# Patient Record
Sex: Male | Born: 1985 | Race: White | Hispanic: No | Marital: Single | Smoking: Current every day smoker
Health system: Southern US, Community
[De-identification: ages and names within clinical notes are randomized; demographics above are authoritative.]

## PROBLEM LIST (undated history)

## (undated) DIAGNOSIS — R569 Unspecified convulsions: Secondary | ICD-10-CM

---

## 2009-04-21 ENCOUNTER — Encounter
Admission: RE | Admit: 2009-04-21 | Discharge: 2009-06-11 | Payer: Self-pay | Admitting: Physical Medicine & Rehabilitation

## 2009-04-22 ENCOUNTER — Ambulatory Visit: Payer: Self-pay | Admitting: Physical Medicine & Rehabilitation

## 2010-10-12 ENCOUNTER — Encounter (HOSPITAL_COMMUNITY)
Admission: RE | Admit: 2010-10-12 | Discharge: 2010-10-12 | Disposition: A | Discharge status: Home / Self Care | Payer: Medicare Other | Source: Ambulatory Visit | Attending: Oral Surgery | Admitting: Oral Surgery

## 2010-10-12 LAB — BASIC METABOLIC PANEL
BUN: 6 mg/dL (ref 6–23)
Chloride: 108 mEq/L (ref 96–112)
Creatinine, Ser: 0.86 mg/dL (ref 0.4–1.5)
GFR calc Af Amer: >60 mL/min (ref >60)
GFR calc non Af Amer: >60 mL/min (ref >60)

## 2010-10-12 LAB — CBC
MCH: 32.3 pg (ref 26.0–34.0)
Platelets: 311 10*3/uL (ref 150–400)
RBC: 4.89 MIL/uL (ref 4.22–5.81)
RDW: 12.6 % (ref 11.5–15.5)
WBC: 8 10*3/uL (ref 4.0–10.5)

## 2010-10-13 ENCOUNTER — Ambulatory Visit (HOSPITAL_COMMUNITY)
Admission: RE | Admit: 2010-10-13 | Discharge: 2010-10-13 | Disposition: A | Discharge status: Home / Self Care | Payer: Medicare Other | Source: Ambulatory Visit | Attending: Oral Surgery | Admitting: Oral Surgery

## 2010-10-13 DIAGNOSIS — Z79899 Other long term (current) drug therapy: Secondary | ICD-10-CM | POA: Insufficient documentation

## 2010-10-13 DIAGNOSIS — K029 Dental caries, unspecified: Secondary | ICD-10-CM | POA: Insufficient documentation

## 2010-10-13 DIAGNOSIS — Z01812 Encounter for preprocedural laboratory examination: Secondary | ICD-10-CM | POA: Insufficient documentation

## 2010-10-19 NOTE — Op Note (Signed)
NAMEDREXEL, IVEY               ACCOUNT NO.:  [id number]  MEDICAL RECORD NO.:  [id number]           PATIENT TYPE:  LOCATION:                                 FACILITY:  PHYSICIAN:  Georgia Lopes, M.D.  DATE OF BIRTH:  [birth year]  DATE OF PROCEDURE:  10/13/2010 DATE OF DISCHARGE:                              OPERATIVE REPORT   PREOPERATIVE DIAGNOSIS:  Nonrestorable teeth numbers 2, 3, 4, 5, 6, 12, 13, 14, 15, 18, 19, 20, 21, 28, 29, 30, 31.  POSTOPERATIVE DIAGNOSIS:  Nonrestorable teeth numbers 2, 3, 4, 5, 6, 12, 13, 14, 15, 18, 19, 20, 21, 28, 29, 30, 31.  PROCEDURES: 1. Surgical removal of teeth numbers 2, 3, 4, 5, 6, 12, 13, 14, 15,     18, 19, 20, 21, 28, 29, 30, 31. 2. Alveoplasty, maxilla and mandible.  SURGEON:  Georgia Lopes, MD  ANESTHESIA:  General, nasal, Dr. Jacklynn Bue attending.  ASSISTANTS: 1. Luberta Mutter, DOMA 2. Marlana Latus, DOMA  INDICATIONS FOR PROCEDURE:  Eddie Fitzgerald is a 25 year old male who was referred by his general dentist for multiple dental extractions and alveoplasty.  His past medical history is relatively benign, however, because of the number of teeth to be removed and the need for airway control, the patient was scheduled for general anesthesia and intubation at [facility].  PROCEDURE:  The patient was taken to the operating room, placed on the table in supine position.  General anesthesia was administered intravenously and a nasal endotracheal tube was placed and secured.  The eyes were protected, and the patient was draped for the procedure.  The posterior pharynx was suctioned and a throat pack was placed.  A 2% lidocaine 1:100,000 epinephrine was infiltrated in the inferior alveolar block in the right and left side and palatal and maxillary buccal and palatal infiltration around teeth to be removed in the maxilla, a total of 15 mL of solution was utilized.  A bite block was placed in the right side of the mouth and the left side  was operated first.  A #15 blade was used make a full-thickness incision on the buccal and lingual aspect of teeth numbers 18, 19, 20, 21 in the mandible and on the buccal and palatal aspects of teeth numbers 12, 13, 14, and 15 in the maxilla.  The periosteum was reflected around all these teeth using a periosteal elevator and an interproximal bone was removed with a striker handpiece and a fissure bur.  The teeth were elevated with a 301 elevator and the upper teeth were removed with a #150 forceps and the lower teeth were removed with the #17 and Asch forceps.  The teeth numbers 18 and 19 required further sectioning and removal with a rongeur and then teeth numbers 12, 13, 14, and 15 also required further sectioning and removal with a rongeur.  Then, the periosteum was further reflected and an alveoplasty was performed in the maxilla and mandible using an egg- shaped bur and bone file.  Sockets areas were irrigated and closed with 3-0 chromic.  Bite block was repositioned.  Attention was turned to the right  side.  A #15 blade was used to make a full-thickness incision buccally and palatally around teeth numbers 2, 3, 4, 5, and 6 in the maxilla and buccally and lingually around the teeth numbers 28, 29, 30, 31.  The periosteum was reflected with periosteal elevator, interproximal bone was removed with a handpiece.  The teeth were elevated with 301 elevator and removed from the mouth with a 150 forceps in the maxilla and the Asch forceps in the mandible in the premolar region and the cow horn forceps in the molar region.  Additional sectioning was required in teeth numbers 30 and 31 and a root tip was removed in the mesial aspect of #30.  In the maxilla, additional sectioning was required around the teeth and they were removed with the rongeurs and the sockets were curetted.  The periosteum was further reflected and alveoplasty was performed in the maxilla and mandible with the egg-shaped  bur and bone file and the areas were irrigated and closed with 3-0 chromic.  The oral cavity was then inspected and found to have good closure and continuity.  The oral cavity was irrigated and suctioned.  Throat pack was removed.  The patient was awakened, taken to the recovery room breathing spontaneously in good condition.  ESTIMATED BLOOD LOSS:  Minimum.  COMPLICATIONS:  None.  SPECIMENS:  None.     Georgia Lopes, M.D.     SMJ/MEDQ  D:  10/13/2010  T:  10/14/2010  Job:  [id number]  Electronically Signed by Ocie Doyne M.D. on 10/19/2010 10:24:59 AM

## 2014-06-22 DIAGNOSIS — G894 Chronic pain syndrome: Secondary | ICD-10-CM | POA: Diagnosis not present

## 2014-06-22 DIAGNOSIS — G40909 Epilepsy, unspecified, not intractable, without status epilepticus: Secondary | ICD-10-CM | POA: Diagnosis not present

## 2014-06-22 DIAGNOSIS — Z79891 Long term (current) use of opiate analgesic: Secondary | ICD-10-CM | POA: Diagnosis not present

## 2014-06-22 DIAGNOSIS — F17210 Nicotine dependence, cigarettes, uncomplicated: Secondary | ICD-10-CM | POA: Diagnosis not present

## 2014-06-22 DIAGNOSIS — K219 Gastro-esophageal reflux disease without esophagitis: Secondary | ICD-10-CM | POA: Diagnosis not present

## 2014-06-22 DIAGNOSIS — R300 Dysuria: Secondary | ICD-10-CM | POA: Diagnosis not present

## 2014-07-04 DIAGNOSIS — G43001 Migraine without aura, not intractable, with status migrainosus: Secondary | ICD-10-CM | POA: Diagnosis not present

## 2014-07-04 DIAGNOSIS — R569 Unspecified convulsions: Secondary | ICD-10-CM | POA: Diagnosis not present

## 2014-07-17 DIAGNOSIS — M791 Myalgia: Secondary | ICD-10-CM | POA: Diagnosis not present

## 2014-07-17 DIAGNOSIS — G8921 Chronic pain due to trauma: Secondary | ICD-10-CM | POA: Diagnosis not present

## 2014-08-06 DIAGNOSIS — G40909 Epilepsy, unspecified, not intractable, without status epilepticus: Secondary | ICD-10-CM | POA: Diagnosis not present

## 2014-08-06 DIAGNOSIS — R300 Dysuria: Secondary | ICD-10-CM | POA: Diagnosis not present

## 2014-08-06 DIAGNOSIS — F17210 Nicotine dependence, cigarettes, uncomplicated: Secondary | ICD-10-CM | POA: Diagnosis not present

## 2014-08-06 DIAGNOSIS — R0781 Pleurodynia: Secondary | ICD-10-CM | POA: Diagnosis not present

## 2014-08-06 DIAGNOSIS — G8929 Other chronic pain: Secondary | ICD-10-CM | POA: Diagnosis not present

## 2014-08-06 DIAGNOSIS — M79606 Pain in leg, unspecified: Secondary | ICD-10-CM | POA: Diagnosis not present

## 2014-08-06 DIAGNOSIS — K219 Gastro-esophageal reflux disease without esophagitis: Secondary | ICD-10-CM | POA: Diagnosis not present

## 2014-08-06 DIAGNOSIS — G43909 Migraine, unspecified, not intractable, without status migrainosus: Secondary | ICD-10-CM | POA: Diagnosis not present

## 2014-08-06 DIAGNOSIS — R109 Unspecified abdominal pain: Secondary | ICD-10-CM | POA: Diagnosis not present

## 2014-08-18 DIAGNOSIS — F17210 Nicotine dependence, cigarettes, uncomplicated: Secondary | ICD-10-CM | POA: Diagnosis not present

## 2014-08-18 DIAGNOSIS — R569 Unspecified convulsions: Secondary | ICD-10-CM | POA: Diagnosis not present

## 2014-10-02 DIAGNOSIS — G8921 Chronic pain due to trauma: Secondary | ICD-10-CM | POA: Diagnosis not present

## 2014-10-02 DIAGNOSIS — M791 Myalgia: Secondary | ICD-10-CM | POA: Diagnosis not present

## 2014-10-03 DIAGNOSIS — G43001 Migraine without aura, not intractable, with status migrainosus: Secondary | ICD-10-CM | POA: Diagnosis not present

## 2014-10-03 DIAGNOSIS — R569 Unspecified convulsions: Secondary | ICD-10-CM | POA: Diagnosis not present

## 2014-11-06 DIAGNOSIS — M546 Pain in thoracic spine: Secondary | ICD-10-CM | POA: Diagnosis not present

## 2014-11-06 DIAGNOSIS — M2550 Pain in unspecified joint: Secondary | ICD-10-CM | POA: Diagnosis not present

## 2014-11-06 DIAGNOSIS — G40909 Epilepsy, unspecified, not intractable, without status epilepticus: Secondary | ICD-10-CM | POA: Diagnosis not present

## 2014-11-06 DIAGNOSIS — F17210 Nicotine dependence, cigarettes, uncomplicated: Secondary | ICD-10-CM | POA: Diagnosis not present

## 2014-11-06 DIAGNOSIS — I10 Essential (primary) hypertension: Secondary | ICD-10-CM | POA: Diagnosis not present

## 2014-11-06 DIAGNOSIS — R252 Cramp and spasm: Secondary | ICD-10-CM | POA: Diagnosis not present

## 2014-11-06 DIAGNOSIS — E782 Mixed hyperlipidemia: Secondary | ICD-10-CM | POA: Diagnosis not present

## 2014-11-06 DIAGNOSIS — Z716 Tobacco abuse counseling: Secondary | ICD-10-CM | POA: Diagnosis not present

## 2014-11-06 DIAGNOSIS — E1165 Type 2 diabetes mellitus with hyperglycemia: Secondary | ICD-10-CM | POA: Diagnosis not present

## 2014-11-06 DIAGNOSIS — Z720 Tobacco use: Secondary | ICD-10-CM | POA: Diagnosis not present

## 2014-11-06 DIAGNOSIS — M545 Low back pain: Secondary | ICD-10-CM | POA: Diagnosis not present

## 2014-12-06 DIAGNOSIS — F11220 Opioid dependence with intoxication, uncomplicated: Secondary | ICD-10-CM | POA: Diagnosis not present

## 2014-12-06 DIAGNOSIS — M546 Pain in thoracic spine: Secondary | ICD-10-CM | POA: Diagnosis not present

## 2014-12-06 DIAGNOSIS — M545 Low back pain: Secondary | ICD-10-CM | POA: Diagnosis not present

## 2014-12-06 DIAGNOSIS — G43909 Migraine, unspecified, not intractable, without status migrainosus: Secondary | ICD-10-CM | POA: Diagnosis not present

## 2014-12-06 DIAGNOSIS — Z79891 Long term (current) use of opiate analgesic: Secondary | ICD-10-CM | POA: Diagnosis not present

## 2014-12-06 DIAGNOSIS — G40909 Epilepsy, unspecified, not intractable, without status epilepticus: Secondary | ICD-10-CM | POA: Diagnosis not present

## 2015-01-02 DIAGNOSIS — R569 Unspecified convulsions: Secondary | ICD-10-CM | POA: Diagnosis not present

## 2015-01-02 DIAGNOSIS — G43001 Migraine without aura, not intractable, with status migrainosus: Secondary | ICD-10-CM | POA: Diagnosis not present

## 2015-01-03 DIAGNOSIS — F11220 Opioid dependence with intoxication, uncomplicated: Secondary | ICD-10-CM | POA: Diagnosis not present

## 2015-01-03 DIAGNOSIS — G43909 Migraine, unspecified, not intractable, without status migrainosus: Secondary | ICD-10-CM | POA: Diagnosis not present

## 2015-01-03 DIAGNOSIS — G40909 Epilepsy, unspecified, not intractable, without status epilepticus: Secondary | ICD-10-CM | POA: Diagnosis not present

## 2015-01-03 DIAGNOSIS — J302 Other seasonal allergic rhinitis: Secondary | ICD-10-CM | POA: Diagnosis not present

## 2015-01-03 DIAGNOSIS — M545 Low back pain: Secondary | ICD-10-CM | POA: Diagnosis not present

## 2015-01-03 DIAGNOSIS — Z79891 Long term (current) use of opiate analgesic: Secondary | ICD-10-CM | POA: Diagnosis not present

## 2015-01-03 DIAGNOSIS — M546 Pain in thoracic spine: Secondary | ICD-10-CM | POA: Diagnosis not present

## 2015-01-31 DIAGNOSIS — F17210 Nicotine dependence, cigarettes, uncomplicated: Secondary | ICD-10-CM | POA: Diagnosis not present

## 2015-01-31 DIAGNOSIS — Z79891 Long term (current) use of opiate analgesic: Secondary | ICD-10-CM | POA: Diagnosis not present

## 2015-01-31 DIAGNOSIS — M545 Low back pain: Secondary | ICD-10-CM | POA: Diagnosis not present

## 2015-01-31 DIAGNOSIS — Z720 Tobacco use: Secondary | ICD-10-CM | POA: Diagnosis not present

## 2015-01-31 DIAGNOSIS — Z716 Tobacco abuse counseling: Secondary | ICD-10-CM | POA: Diagnosis not present

## 2015-01-31 DIAGNOSIS — F11220 Opioid dependence with intoxication, uncomplicated: Secondary | ICD-10-CM | POA: Diagnosis not present

## 2015-01-31 DIAGNOSIS — J984 Other disorders of lung: Secondary | ICD-10-CM | POA: Diagnosis not present

## 2015-03-03 DIAGNOSIS — M546 Pain in thoracic spine: Secondary | ICD-10-CM | POA: Diagnosis not present

## 2015-03-03 DIAGNOSIS — M545 Low back pain: Secondary | ICD-10-CM | POA: Diagnosis not present

## 2015-03-03 DIAGNOSIS — Z5181 Encounter for therapeutic drug level monitoring: Secondary | ICD-10-CM | POA: Diagnosis not present

## 2015-03-03 DIAGNOSIS — Z23 Encounter for immunization: Secondary | ICD-10-CM | POA: Diagnosis not present

## 2015-03-03 DIAGNOSIS — Z79891 Long term (current) use of opiate analgesic: Secondary | ICD-10-CM | POA: Diagnosis not present

## 2015-03-03 DIAGNOSIS — G40909 Epilepsy, unspecified, not intractable, without status epilepticus: Secondary | ICD-10-CM | POA: Diagnosis not present

## 2015-03-03 DIAGNOSIS — K580 Irritable bowel syndrome with diarrhea: Secondary | ICD-10-CM | POA: Diagnosis not present

## 2015-03-03 DIAGNOSIS — F11220 Opioid dependence with intoxication, uncomplicated: Secondary | ICD-10-CM | POA: Diagnosis not present

## 2015-03-12 DIAGNOSIS — G894 Chronic pain syndrome: Secondary | ICD-10-CM | POA: Diagnosis not present

## 2015-03-12 DIAGNOSIS — Z79891 Long term (current) use of opiate analgesic: Secondary | ICD-10-CM | POA: Diagnosis not present

## 2015-04-02 DIAGNOSIS — M545 Low back pain: Secondary | ICD-10-CM | POA: Diagnosis not present

## 2015-04-02 DIAGNOSIS — M546 Pain in thoracic spine: Secondary | ICD-10-CM | POA: Diagnosis not present

## 2015-04-02 DIAGNOSIS — Z79891 Long term (current) use of opiate analgesic: Secondary | ICD-10-CM | POA: Diagnosis not present

## 2015-04-02 DIAGNOSIS — G43909 Migraine, unspecified, not intractable, without status migrainosus: Secondary | ICD-10-CM | POA: Diagnosis not present

## 2015-04-02 DIAGNOSIS — F11220 Opioid dependence with intoxication, uncomplicated: Secondary | ICD-10-CM | POA: Diagnosis not present

## 2015-04-30 DIAGNOSIS — Z79891 Long term (current) use of opiate analgesic: Secondary | ICD-10-CM | POA: Diagnosis not present

## 2015-04-30 DIAGNOSIS — K219 Gastro-esophageal reflux disease without esophagitis: Secondary | ICD-10-CM | POA: Diagnosis not present

## 2015-04-30 DIAGNOSIS — Z681 Body mass index (BMI) 19 or less, adult: Secondary | ICD-10-CM | POA: Diagnosis not present

## 2015-04-30 DIAGNOSIS — I10 Essential (primary) hypertension: Secondary | ICD-10-CM | POA: Diagnosis not present

## 2015-04-30 DIAGNOSIS — M545 Low back pain: Secondary | ICD-10-CM | POA: Diagnosis not present

## 2015-04-30 DIAGNOSIS — Z5181 Encounter for therapeutic drug level monitoring: Secondary | ICD-10-CM | POA: Diagnosis not present

## 2015-04-30 DIAGNOSIS — F11220 Opioid dependence with intoxication, uncomplicated: Secondary | ICD-10-CM | POA: Diagnosis not present

## 2015-04-30 DIAGNOSIS — E782 Mixed hyperlipidemia: Secondary | ICD-10-CM | POA: Diagnosis not present

## 2015-04-30 DIAGNOSIS — E1165 Type 2 diabetes mellitus with hyperglycemia: Secondary | ICD-10-CM | POA: Diagnosis not present

## 2015-05-08 DIAGNOSIS — G43001 Migraine without aura, not intractable, with status migrainosus: Secondary | ICD-10-CM | POA: Diagnosis not present

## 2015-05-08 DIAGNOSIS — R569 Unspecified convulsions: Secondary | ICD-10-CM | POA: Diagnosis not present

## 2015-05-29 DIAGNOSIS — Z5181 Encounter for therapeutic drug level monitoring: Secondary | ICD-10-CM | POA: Diagnosis not present

## 2015-05-29 DIAGNOSIS — F11220 Opioid dependence with intoxication, uncomplicated: Secondary | ICD-10-CM | POA: Diagnosis not present

## 2015-05-29 DIAGNOSIS — M546 Pain in thoracic spine: Secondary | ICD-10-CM | POA: Diagnosis not present

## 2015-05-29 DIAGNOSIS — G40909 Epilepsy, unspecified, not intractable, without status epilepticus: Secondary | ICD-10-CM | POA: Diagnosis not present

## 2015-05-29 DIAGNOSIS — M545 Low back pain: Secondary | ICD-10-CM | POA: Diagnosis not present

## 2015-05-29 DIAGNOSIS — Z79891 Long term (current) use of opiate analgesic: Secondary | ICD-10-CM | POA: Diagnosis not present

## 2015-05-29 DIAGNOSIS — Z6820 Body mass index (BMI) 20.0-20.9, adult: Secondary | ICD-10-CM | POA: Diagnosis not present

## 2015-07-01 DIAGNOSIS — S61411A Laceration without foreign body of right hand, initial encounter: Secondary | ICD-10-CM | POA: Diagnosis not present

## 2015-07-01 DIAGNOSIS — F11220 Opioid dependence with intoxication, uncomplicated: Secondary | ICD-10-CM | POA: Diagnosis not present

## 2015-07-01 DIAGNOSIS — Z79891 Long term (current) use of opiate analgesic: Secondary | ICD-10-CM | POA: Diagnosis not present

## 2015-07-01 DIAGNOSIS — G8929 Other chronic pain: Secondary | ICD-10-CM | POA: Diagnosis not present

## 2015-07-30 DIAGNOSIS — Z79891 Long term (current) use of opiate analgesic: Secondary | ICD-10-CM | POA: Diagnosis not present

## 2015-07-30 DIAGNOSIS — G8929 Other chronic pain: Secondary | ICD-10-CM | POA: Diagnosis not present

## 2015-07-30 DIAGNOSIS — F1190 Opioid use, unspecified, uncomplicated: Secondary | ICD-10-CM | POA: Diagnosis not present

## 2015-07-30 DIAGNOSIS — F11220 Opioid dependence with intoxication, uncomplicated: Secondary | ICD-10-CM | POA: Diagnosis not present

## 2015-07-30 DIAGNOSIS — M545 Low back pain: Secondary | ICD-10-CM | POA: Diagnosis not present

## 2015-07-30 DIAGNOSIS — Z681 Body mass index (BMI) 19 or less, adult: Secondary | ICD-10-CM | POA: Diagnosis not present

## 2015-08-27 DIAGNOSIS — Z79891 Long term (current) use of opiate analgesic: Secondary | ICD-10-CM | POA: Diagnosis not present

## 2015-08-27 DIAGNOSIS — Z23 Encounter for immunization: Secondary | ICD-10-CM | POA: Diagnosis not present

## 2015-08-27 DIAGNOSIS — F11220 Opioid dependence with intoxication, uncomplicated: Secondary | ICD-10-CM | POA: Diagnosis not present

## 2015-08-27 DIAGNOSIS — F1190 Opioid use, unspecified, uncomplicated: Secondary | ICD-10-CM | POA: Diagnosis not present

## 2015-08-27 DIAGNOSIS — Z681 Body mass index (BMI) 19 or less, adult: Secondary | ICD-10-CM | POA: Diagnosis not present

## 2015-08-27 DIAGNOSIS — G8929 Other chronic pain: Secondary | ICD-10-CM | POA: Diagnosis not present

## 2015-09-14 DIAGNOSIS — K219 Gastro-esophageal reflux disease without esophagitis: Secondary | ICD-10-CM | POA: Diagnosis not present

## 2015-09-14 DIAGNOSIS — M549 Dorsalgia, unspecified: Secondary | ICD-10-CM | POA: Diagnosis not present

## 2015-09-14 DIAGNOSIS — F419 Anxiety disorder, unspecified: Secondary | ICD-10-CM | POA: Diagnosis not present

## 2015-09-14 DIAGNOSIS — G40909 Epilepsy, unspecified, not intractable, without status epilepticus: Secondary | ICD-10-CM | POA: Diagnosis not present

## 2015-09-14 DIAGNOSIS — F17210 Nicotine dependence, cigarettes, uncomplicated: Secondary | ICD-10-CM | POA: Diagnosis not present

## 2015-09-14 DIAGNOSIS — F329 Major depressive disorder, single episode, unspecified: Secondary | ICD-10-CM | POA: Diagnosis not present

## 2015-09-14 DIAGNOSIS — Z886 Allergy status to analgesic agent status: Secondary | ICD-10-CM | POA: Diagnosis not present

## 2015-09-14 DIAGNOSIS — R51 Headache: Secondary | ICD-10-CM | POA: Diagnosis not present

## 2015-09-14 DIAGNOSIS — G43909 Migraine, unspecified, not intractable, without status migrainosus: Secondary | ICD-10-CM | POA: Diagnosis not present

## 2015-09-14 DIAGNOSIS — M79606 Pain in leg, unspecified: Secondary | ICD-10-CM | POA: Diagnosis not present

## 2015-09-14 DIAGNOSIS — G8929 Other chronic pain: Secondary | ICD-10-CM | POA: Diagnosis not present

## 2015-09-14 DIAGNOSIS — R569 Unspecified convulsions: Secondary | ICD-10-CM | POA: Diagnosis not present

## 2015-09-19 DIAGNOSIS — R51 Headache: Secondary | ICD-10-CM | POA: Diagnosis not present

## 2015-09-19 DIAGNOSIS — G43909 Migraine, unspecified, not intractable, without status migrainosus: Secondary | ICD-10-CM | POA: Diagnosis not present

## 2015-09-23 ENCOUNTER — Other Ambulatory Visit: Payer: Self-pay

## 2015-09-23 NOTE — Patient Outreach (Signed)
[facility]) Care Management  09/23/2015  Glory BuffMichael A Theiss [birth year] [id number]   Telephone call to patient regarding [employer] health care high risk referral. Unable to reach patient. HIPAA compliant voice message left with call back phone number.   PLAN:  RNCM will attempt 2nd telephone call to patient within 1 week.   George InaDavina Mylan Schwarz RN,BSN,CCM [facility] Telephonic  [phone]

## 2015-09-24 DIAGNOSIS — Z79891 Long term (current) use of opiate analgesic: Secondary | ICD-10-CM | POA: Diagnosis not present

## 2015-09-24 DIAGNOSIS — E790 Hyperuricemia without signs of inflammatory arthritis and tophaceous disease: Secondary | ICD-10-CM | POA: Diagnosis not present

## 2015-09-24 DIAGNOSIS — F1190 Opioid use, unspecified, uncomplicated: Secondary | ICD-10-CM | POA: Diagnosis not present

## 2015-09-24 DIAGNOSIS — E782 Mixed hyperlipidemia: Secondary | ICD-10-CM | POA: Diagnosis not present

## 2015-09-24 DIAGNOSIS — I10 Essential (primary) hypertension: Secondary | ICD-10-CM | POA: Diagnosis not present

## 2015-09-24 DIAGNOSIS — F11220 Opioid dependence with intoxication, uncomplicated: Secondary | ICD-10-CM | POA: Diagnosis not present

## 2015-09-24 DIAGNOSIS — F17210 Nicotine dependence, cigarettes, uncomplicated: Secondary | ICD-10-CM | POA: Diagnosis not present

## 2015-09-24 DIAGNOSIS — E039 Hypothyroidism, unspecified: Secondary | ICD-10-CM | POA: Diagnosis not present

## 2015-09-24 DIAGNOSIS — E1165 Type 2 diabetes mellitus with hyperglycemia: Secondary | ICD-10-CM | POA: Diagnosis not present

## 2015-09-24 DIAGNOSIS — M545 Low back pain: Secondary | ICD-10-CM | POA: Diagnosis not present

## 2015-09-24 DIAGNOSIS — Z5181 Encounter for therapeutic drug level monitoring: Secondary | ICD-10-CM | POA: Diagnosis not present

## 2015-09-24 DIAGNOSIS — Z681 Body mass index (BMI) 19 or less, adult: Secondary | ICD-10-CM | POA: Diagnosis not present

## 2015-09-24 DIAGNOSIS — R252 Cramp and spasm: Secondary | ICD-10-CM | POA: Diagnosis not present

## 2015-09-25 ENCOUNTER — Other Ambulatory Visit: Payer: Self-pay

## 2015-09-25 NOTE — Patient Outreach (Signed)
[facility]) Care Management  09/25/2015  Eddie Fitzgerald [birth year] [id number]  Telephone call to patient regarding united health care high risk assessment.  Call attempted to home number and mobile number. Unable to reach. HIPAA compliant voice message left with call back phone number. Call attempted to emergency contact. Person answering phone states patient is not there. HIPAA compliant voice message left with call back phone number.   PLAN: RNCM will attempt 3rd telephone outreach to patient within 1 week.   George InaDavina Halli Equihua RN,BSN,CCM [facility] Telephonic  [phone]

## 2015-09-29 ENCOUNTER — Ambulatory Visit: Payer: Self-pay

## 2015-09-30 ENCOUNTER — Other Ambulatory Visit: Payer: Self-pay

## 2015-09-30 NOTE — Patient Outreach (Signed)
[facility]) Care Management  09/30/2015  Glory BuffMichael A Husted Jan 23, 1986 [id number]  Third telephone call to patient regarding united health care high risk referral. Unable to reach patient. Attempted call to listed home number. Phone is no longer in services. Attempted call to mobile number and patients emergency contact  Fannie KneeSue lattimore,  HIPAA compliant voice message with call back phone number left at both numbers.  PLAN: RNCM will send patient outreach letter to attempt contact.   George InaDavina Noorah Giammona RN,BSN,CCM [facility] Telephonic  [phone]

## 2015-10-22 ENCOUNTER — Other Ambulatory Visit: Payer: Self-pay

## 2015-10-22 NOTE — Patient Outreach (Signed)
[facility] ([facility]) Care Management  10/22/2015  Glory BuffMichael A Menger 10-14-1985 [id number]   No response from patient after 3 telephone attempts and outreach letter.   PLAN: RNCM will refer patient to Sherle PoeNicole Robinson for closure due to inability to contact Unable to notify primary MD due to no primary MD being listed.   George InaDavina Maisee Vollman RN,BSN,CCM [facility] Telephonic  [phone]

## 2015-10-28 DIAGNOSIS — K219 Gastro-esophageal reflux disease without esophagitis: Secondary | ICD-10-CM | POA: Diagnosis not present

## 2015-10-28 DIAGNOSIS — I499 Cardiac arrhythmia, unspecified: Secondary | ICD-10-CM | POA: Diagnosis not present

## 2015-10-28 DIAGNOSIS — M542 Cervicalgia: Secondary | ICD-10-CM | POA: Diagnosis not present

## 2015-10-28 DIAGNOSIS — E876 Hypokalemia: Secondary | ICD-10-CM | POA: Diagnosis not present

## 2015-10-28 DIAGNOSIS — G8929 Other chronic pain: Secondary | ICD-10-CM | POA: Diagnosis not present

## 2015-10-28 DIAGNOSIS — S0181XA Laceration without foreign body of other part of head, initial encounter: Secondary | ICD-10-CM | POA: Diagnosis not present

## 2015-10-28 DIAGNOSIS — F17210 Nicotine dependence, cigarettes, uncomplicated: Secondary | ICD-10-CM | POA: Diagnosis not present

## 2015-10-28 DIAGNOSIS — G40909 Epilepsy, unspecified, not intractable, without status epilepticus: Secondary | ICD-10-CM | POA: Diagnosis not present

## 2015-10-28 DIAGNOSIS — M79606 Pain in leg, unspecified: Secondary | ICD-10-CM | POA: Diagnosis not present

## 2015-10-28 DIAGNOSIS — R569 Unspecified convulsions: Secondary | ICD-10-CM | POA: Diagnosis not present

## 2015-10-28 DIAGNOSIS — R51 Headache: Secondary | ICD-10-CM | POA: Diagnosis not present

## 2015-10-31 DIAGNOSIS — M791 Myalgia: Secondary | ICD-10-CM | POA: Diagnosis not present

## 2015-10-31 DIAGNOSIS — G43909 Migraine, unspecified, not intractable, without status migrainosus: Secondary | ICD-10-CM | POA: Diagnosis not present

## 2015-10-31 DIAGNOSIS — R569 Unspecified convulsions: Secondary | ICD-10-CM | POA: Diagnosis not present

## 2015-10-31 DIAGNOSIS — R52 Pain, unspecified: Secondary | ICD-10-CM | POA: Diagnosis not present

## 2015-10-31 DIAGNOSIS — Z79899 Other long term (current) drug therapy: Secondary | ICD-10-CM | POA: Diagnosis not present

## 2015-11-04 DIAGNOSIS — R569 Unspecified convulsions: Secondary | ICD-10-CM | POA: Diagnosis not present

## 2015-11-04 DIAGNOSIS — M542 Cervicalgia: Secondary | ICD-10-CM | POA: Diagnosis not present

## 2015-11-04 DIAGNOSIS — G8929 Other chronic pain: Secondary | ICD-10-CM | POA: Diagnosis not present

## 2015-11-04 DIAGNOSIS — Z4802 Encounter for removal of sutures: Secondary | ICD-10-CM | POA: Diagnosis not present

## 2015-11-04 DIAGNOSIS — S0181XS Laceration without foreign body of other part of head, sequela: Secondary | ICD-10-CM | POA: Diagnosis not present

## 2015-11-04 DIAGNOSIS — G43909 Migraine, unspecified, not intractable, without status migrainosus: Secondary | ICD-10-CM | POA: Diagnosis not present

## 2015-11-04 DIAGNOSIS — M79606 Pain in leg, unspecified: Secondary | ICD-10-CM | POA: Diagnosis not present

## 2015-11-04 DIAGNOSIS — Z886 Allergy status to analgesic agent status: Secondary | ICD-10-CM | POA: Diagnosis not present

## 2015-11-04 DIAGNOSIS — S161XXA Strain of muscle, fascia and tendon at neck level, initial encounter: Secondary | ICD-10-CM | POA: Diagnosis not present

## 2015-11-04 DIAGNOSIS — S161XXD Strain of muscle, fascia and tendon at neck level, subsequent encounter: Secondary | ICD-10-CM | POA: Diagnosis not present

## 2015-11-04 DIAGNOSIS — K219 Gastro-esophageal reflux disease without esophagitis: Secondary | ICD-10-CM | POA: Diagnosis not present

## 2015-11-06 DIAGNOSIS — R569 Unspecified convulsions: Secondary | ICD-10-CM | POA: Diagnosis not present

## 2015-11-10 DIAGNOSIS — G894 Chronic pain syndrome: Secondary | ICD-10-CM | POA: Diagnosis not present

## 2015-11-10 DIAGNOSIS — G8911 Acute pain due to trauma: Secondary | ICD-10-CM | POA: Diagnosis not present

## 2015-11-27 DIAGNOSIS — M79661 Pain in right lower leg: Secondary | ICD-10-CM | POA: Diagnosis not present

## 2015-11-27 DIAGNOSIS — J45909 Unspecified asthma, uncomplicated: Secondary | ICD-10-CM | POA: Diagnosis not present

## 2015-11-27 DIAGNOSIS — M79671 Pain in right foot: Secondary | ICD-10-CM | POA: Diagnosis not present

## 2015-11-27 DIAGNOSIS — S93401A Sprain of unspecified ligament of right ankle, initial encounter: Secondary | ICD-10-CM | POA: Diagnosis not present

## 2015-11-27 DIAGNOSIS — M25571 Pain in right ankle and joints of right foot: Secondary | ICD-10-CM | POA: Diagnosis not present

## 2015-11-27 DIAGNOSIS — Z79899 Other long term (current) drug therapy: Secondary | ICD-10-CM | POA: Diagnosis not present

## 2015-11-27 DIAGNOSIS — R569 Unspecified convulsions: Secondary | ICD-10-CM | POA: Diagnosis not present

## 2015-11-27 DIAGNOSIS — K219 Gastro-esophageal reflux disease without esophagitis: Secondary | ICD-10-CM | POA: Diagnosis not present

## 2015-11-27 DIAGNOSIS — M542 Cervicalgia: Secondary | ICD-10-CM | POA: Diagnosis not present

## 2015-11-27 DIAGNOSIS — S161XXA Strain of muscle, fascia and tendon at neck level, initial encounter: Secondary | ICD-10-CM | POA: Diagnosis not present

## 2015-12-21 DIAGNOSIS — S90511A Abrasion, right ankle, initial encounter: Secondary | ICD-10-CM | POA: Diagnosis not present

## 2016-01-22 DIAGNOSIS — G40909 Epilepsy, unspecified, not intractable, without status epilepticus: Secondary | ICD-10-CM | POA: Diagnosis not present

## 2016-01-22 DIAGNOSIS — G8929 Other chronic pain: Secondary | ICD-10-CM | POA: Diagnosis not present

## 2016-01-22 DIAGNOSIS — G43001 Migraine without aura, not intractable, with status migrainosus: Secondary | ICD-10-CM | POA: Diagnosis not present

## 2016-01-22 DIAGNOSIS — M545 Low back pain: Secondary | ICD-10-CM | POA: Diagnosis not present

## 2016-01-27 DIAGNOSIS — H00015 Hordeolum externum left lower eyelid: Secondary | ICD-10-CM | POA: Diagnosis not present

## 2016-02-10 DIAGNOSIS — G8929 Other chronic pain: Secondary | ICD-10-CM | POA: Diagnosis not present

## 2016-02-10 DIAGNOSIS — F419 Anxiety disorder, unspecified: Secondary | ICD-10-CM | POA: Diagnosis not present

## 2016-02-10 DIAGNOSIS — F410 Panic disorder [episodic paroxysmal anxiety] without agoraphobia: Secondary | ICD-10-CM | POA: Diagnosis not present

## 2016-03-01 DIAGNOSIS — S60222A Contusion of left hand, initial encounter: Secondary | ICD-10-CM | POA: Diagnosis not present

## 2016-03-01 DIAGNOSIS — S6990XA Unspecified injury of unspecified wrist, hand and finger(s), initial encounter: Secondary | ICD-10-CM | POA: Diagnosis not present

## 2016-03-01 DIAGNOSIS — S6992XA Unspecified injury of left wrist, hand and finger(s), initial encounter: Secondary | ICD-10-CM | POA: Diagnosis not present

## 2016-03-02 DIAGNOSIS — G4700 Insomnia, unspecified: Secondary | ICD-10-CM | POA: Diagnosis not present

## 2016-03-02 DIAGNOSIS — R413 Other amnesia: Secondary | ICD-10-CM | POA: Diagnosis not present

## 2016-03-02 DIAGNOSIS — R569 Unspecified convulsions: Secondary | ICD-10-CM | POA: Diagnosis not present

## 2016-03-07 DIAGNOSIS — R109 Unspecified abdominal pain: Secondary | ICD-10-CM | POA: Diagnosis not present

## 2016-03-15 DIAGNOSIS — M542 Cervicalgia: Secondary | ICD-10-CM | POA: Diagnosis not present

## 2016-03-15 DIAGNOSIS — Z886 Allergy status to analgesic agent status: Secondary | ICD-10-CM | POA: Diagnosis not present

## 2016-03-15 DIAGNOSIS — Z9049 Acquired absence of other specified parts of digestive tract: Secondary | ICD-10-CM | POA: Diagnosis not present

## 2016-03-15 DIAGNOSIS — R404 Transient alteration of awareness: Secondary | ICD-10-CM | POA: Diagnosis not present

## 2016-03-15 DIAGNOSIS — K219 Gastro-esophageal reflux disease without esophagitis: Secondary | ICD-10-CM | POA: Diagnosis not present

## 2016-03-15 DIAGNOSIS — Z9119 Patient's noncompliance with other medical treatment and regimen: Secondary | ICD-10-CM | POA: Diagnosis not present

## 2016-03-15 DIAGNOSIS — R569 Unspecified convulsions: Secondary | ICD-10-CM | POA: Diagnosis not present

## 2016-03-15 DIAGNOSIS — M545 Low back pain: Secondary | ICD-10-CM | POA: Diagnosis not present

## 2016-03-15 DIAGNOSIS — M549 Dorsalgia, unspecified: Secondary | ICD-10-CM | POA: Diagnosis not present

## 2016-03-15 DIAGNOSIS — R531 Weakness: Secondary | ICD-10-CM | POA: Diagnosis not present

## 2016-03-15 DIAGNOSIS — M5489 Other dorsalgia: Secondary | ICD-10-CM | POA: Diagnosis not present

## 2016-03-22 DIAGNOSIS — K219 Gastro-esophageal reflux disease without esophagitis: Secondary | ICD-10-CM | POA: Diagnosis not present

## 2016-03-22 DIAGNOSIS — M545 Low back pain: Secondary | ICD-10-CM | POA: Diagnosis not present

## 2016-03-22 DIAGNOSIS — G8929 Other chronic pain: Secondary | ICD-10-CM | POA: Diagnosis not present

## 2016-04-06 DIAGNOSIS — R569 Unspecified convulsions: Secondary | ICD-10-CM | POA: Diagnosis not present

## 2016-04-06 DIAGNOSIS — H5589 Other irregular eye movements: Secondary | ICD-10-CM | POA: Diagnosis not present

## 2016-04-06 DIAGNOSIS — R9401 Abnormal electroencephalogram [EEG]: Secondary | ICD-10-CM | POA: Diagnosis not present

## 2016-04-06 DIAGNOSIS — G9349 Other encephalopathy: Secondary | ICD-10-CM | POA: Diagnosis not present

## 2016-05-27 DIAGNOSIS — G43001 Migraine without aura, not intractable, with status migrainosus: Secondary | ICD-10-CM | POA: Diagnosis not present

## 2016-06-01 DIAGNOSIS — S61011A Laceration without foreign body of right thumb without damage to nail, initial encounter: Secondary | ICD-10-CM | POA: Diagnosis not present

## 2016-06-06 DIAGNOSIS — S61011D Laceration without foreign body of right thumb without damage to nail, subsequent encounter: Secondary | ICD-10-CM | POA: Diagnosis not present

## 2016-06-06 DIAGNOSIS — S61011A Laceration without foreign body of right thumb without damage to nail, initial encounter: Secondary | ICD-10-CM | POA: Diagnosis not present

## 2016-06-06 DIAGNOSIS — M79644 Pain in right finger(s): Secondary | ICD-10-CM | POA: Diagnosis not present

## 2016-06-07 DIAGNOSIS — M5489 Other dorsalgia: Secondary | ICD-10-CM | POA: Diagnosis not present

## 2016-06-07 DIAGNOSIS — K219 Gastro-esophageal reflux disease without esophagitis: Secondary | ICD-10-CM | POA: Diagnosis not present

## 2016-06-07 DIAGNOSIS — Z79899 Other long term (current) drug therapy: Secondary | ICD-10-CM | POA: Diagnosis not present

## 2016-06-07 DIAGNOSIS — M791 Myalgia: Secondary | ICD-10-CM | POA: Diagnosis not present

## 2016-06-07 DIAGNOSIS — R569 Unspecified convulsions: Secondary | ICD-10-CM | POA: Diagnosis not present

## 2016-06-07 DIAGNOSIS — Z886 Allergy status to analgesic agent status: Secondary | ICD-10-CM | POA: Diagnosis not present

## 2016-06-20 DIAGNOSIS — S8392XA Sprain of unspecified site of left knee, initial encounter: Secondary | ICD-10-CM | POA: Diagnosis not present

## 2016-06-20 DIAGNOSIS — S8992XA Unspecified injury of left lower leg, initial encounter: Secondary | ICD-10-CM | POA: Diagnosis not present

## 2016-06-20 DIAGNOSIS — M25562 Pain in left knee: Secondary | ICD-10-CM | POA: Diagnosis not present

## 2019-05-01 ENCOUNTER — Emergency Department (HOSPITAL_COMMUNITY): Payer: Medicare Other

## 2019-05-01 ENCOUNTER — Other Ambulatory Visit: Payer: Self-pay

## 2019-05-01 ENCOUNTER — Emergency Department (HOSPITAL_COMMUNITY)
Admission: EM | Admit: 2019-05-01 | Discharge: 2019-05-01 | Disposition: A | Discharge status: Home / Self Care | Payer: Medicare Other | Attending: Emergency Medicine | Admitting: Emergency Medicine

## 2019-05-01 ENCOUNTER — Encounter (HOSPITAL_COMMUNITY): Payer: Self-pay | Admitting: Emergency Medicine

## 2019-05-01 DIAGNOSIS — Y92512 Supermarket, store or market as the place of occurrence of the external cause: Secondary | ICD-10-CM | POA: Diagnosis not present

## 2019-05-01 DIAGNOSIS — Y9301 Activity, walking, marching and hiking: Secondary | ICD-10-CM | POA: Diagnosis not present

## 2019-05-01 DIAGNOSIS — G40909 Epilepsy, unspecified, not intractable, without status epilepticus: Secondary | ICD-10-CM | POA: Insufficient documentation

## 2019-05-01 DIAGNOSIS — W010XXA Fall on same level from slipping, tripping and stumbling without subsequent striking against object, initial encounter: Secondary | ICD-10-CM | POA: Diagnosis not present

## 2019-05-01 DIAGNOSIS — Y998 Other external cause status: Secondary | ICD-10-CM | POA: Diagnosis not present

## 2019-05-01 DIAGNOSIS — S0240FA Zygomatic fracture, left side, initial encounter for closed fracture: Secondary | ICD-10-CM | POA: Diagnosis not present

## 2019-05-01 DIAGNOSIS — Z23 Encounter for immunization: Secondary | ICD-10-CM | POA: Diagnosis not present

## 2019-05-01 DIAGNOSIS — S0181XA Laceration without foreign body of other part of head, initial encounter: Secondary | ICD-10-CM | POA: Diagnosis not present

## 2019-05-01 DIAGNOSIS — S0990XA Unspecified injury of head, initial encounter: Secondary | ICD-10-CM | POA: Diagnosis present

## 2019-05-01 DIAGNOSIS — F17200 Nicotine dependence, unspecified, uncomplicated: Secondary | ICD-10-CM | POA: Diagnosis not present

## 2019-05-01 HISTORY — DX: Unspecified convulsions: R56.9

## 2019-05-01 LAB — CBC WITH DIFFERENTIAL/PLATELET
Abs Immature Granulocytes: 0.04 10*3/uL (ref 0.00–0.07)
Basophils Absolute: 0.1 10*3/uL (ref 0.0–0.1)
Basophils Relative: 1 %
Eosinophils Absolute: 0.1 10*3/uL (ref 0.0–0.5)
Eosinophils Relative: 1 %
HCT: 44.3 % (ref 39.0–52.0)
Hemoglobin: 14.4 g/dL (ref 13.0–17.0)
Immature Granulocytes: 0 %
Lymphocytes Relative: 7 %
Lymphs Abs: 0.8 10*3/uL (ref 0.7–4.0)
MCH: 31 pg (ref 26.0–34.0)
MCHC: 32.5 g/dL (ref 30.0–36.0)
MCV: 95.3 fL (ref 80.0–100.0)
Monocytes Absolute: 0.4 10*3/uL (ref 0.1–1.0)
Monocytes Relative: 4 %
Neutro Abs: 9.3 10*3/uL — ABNORMAL HIGH (ref 1.7–7.7)
Neutrophils Relative %: 87 %
Platelets: 177 10*3/uL (ref 150–400)
RBC: 4.65 MIL/uL (ref 4.22–5.81)
RDW: 13.8 % (ref 11.5–15.5)
WBC: 10.7 10*3/uL — ABNORMAL HIGH (ref 4.0–10.5)
nRBC: 0 % (ref 0.0–0.2)

## 2019-05-01 LAB — BASIC METABOLIC PANEL
Anion gap: 12 (ref 5–15)
BUN: 10 mg/dL (ref 6–20)
CO2: 19 mmol/L — ABNORMAL LOW (ref 22–32)
Calcium: 9.3 mg/dL (ref 8.9–10.3)
Chloride: 108 mmol/L (ref 98–111)
Creatinine, Ser: 0.93 mg/dL (ref 0.61–1.24)
GFR calc Af Amer: >60 mL/min (ref >60)
GFR calc non Af Amer: >60 mL/min (ref >60)
Glucose, Bld: 85 mg/dL (ref 70–99)
Potassium: 4.7 mmol/L (ref 3.5–5.1)
Sodium: 139 mmol/L (ref 135–145)

## 2019-05-01 LAB — CBG MONITORING, ED: Glucose-Capillary: 91 mg/dL (ref 70–99)

## 2019-05-01 LAB — ETHANOL: Alcohol, Ethyl (B): <10 mg/dL (ref <10)

## 2019-05-01 MED ORDER — LIDOCAINE-EPINEPHRINE 1 %-1:100000 IJ SOLN
20.0000 mL | Freq: Once | INTRAMUSCULAR | Status: AC
  Administered 2019-05-01: 20 mL
  Filled 2019-05-01 (×2): qty 20

## 2019-05-01 MED ORDER — ACETAMINOPHEN 325 MG PO TABS
650.0000 mg | ORAL_TABLET | Freq: Once | ORAL | Status: AC
  Administered 2019-05-01: 15:00:00 650 mg via ORAL
  Filled 2019-05-01: qty 2

## 2019-05-01 MED ORDER — LIDOCAINE-EPINEPHRINE (PF) 2 %-1:200000 IJ SOLN
20.0000 mL | Freq: Once | INTRAMUSCULAR | Status: AC
  Administered 2019-05-01: 20 mL
  Filled 2019-05-01: qty 20

## 2019-05-01 MED ORDER — NAPROXEN 250 MG PO TABS
500.0000 mg | ORAL_TABLET | Freq: Once | ORAL | Status: AC
  Administered 2019-05-01: 15:00:00 500 mg via ORAL
  Filled 2019-05-01: qty 2

## 2019-05-01 MED ORDER — BACITRACIN ZINC 500 UNIT/GM EX OINT: 1.0000 "application " | TOPICAL_OINTMENT | Freq: Two times a day (BID) | CUTANEOUS | 0 refills | Status: AC

## 2019-05-01 MED ORDER — SODIUM CHLORIDE 0.9 % IV SOLN
INTRAVENOUS | Status: DC
  Administered 2019-05-01: 10:00:00 via INTRAVENOUS

## 2019-05-01 MED ORDER — TETANUS-DIPHTH-ACELL PERTUSSIS 5-2.5-18.5 LF-MCG/0.5 IM SUSP
0.5000 mL | Freq: Once | INTRAMUSCULAR | Status: AC
  Administered 2019-05-01: 10:00:00 0.5 mL via INTRAMUSCULAR
  Filled 2019-05-01: qty 0.5

## 2019-05-01 MED ORDER — FENTANYL CITRATE (PF) 100 MCG/2ML IJ SOLN
50.0000 ug | Freq: Once | INTRAMUSCULAR | Status: AC
  Administered 2019-05-01: 11:00:00 50 ug via INTRAVENOUS
  Filled 2019-05-01: qty 2

## 2019-05-01 MED ORDER — CEPHALEXIN 500 MG PO CAPS: 500.0000 mg | ORAL_CAPSULE | Freq: Four times a day (QID) | ORAL | 0 refills | Status: AC

## 2019-05-01 NOTE — Discharge Instructions (Addendum)
You are seen in the ER for seizures.  We discussed the case with neurology team at [facility] and they will try to set up an appointment for you as soon as possible.  Please call your neurologist tomorrow afternoon if you have not heard back from them.  They might consider sending you to [facility] for higher level of care.  Additionally, unfortunately you have suffered laceration to your forehead and fracture to your face.  Those injuries are stable.  Laceration care instructions:  -Cleanse wound with 1/2 strength hydrogen peroxide on a Q tip twice daily. Pat dry. Apply bacitracin ointment along incision.  -You may shower like you normally would. Do not scrub over incision. Pat dry and then reapply the bacitracin ointment. -No strenuous exercise or heavy lifting until 14 days. Walk/ambulate at home often. Perform calf and stretch exercises often to prevent blood clot formation.  -No nose blowing for 7 days. -Use nasal saline spray as needed for nasal congestion. -Use ice packs over your nose and over the left eyebrow for 15-minute intervals at least 4 times a day for the next 5 days.

## 2019-05-01 NOTE — Consult Note (Signed)
OTOLARYNGOLOGY CONSULTATION   Primary Care Physician: System, Provider Not In Patient Location at Initial Consult: Emergency Department Chief Complaint/Reason for Consult: laceration to forehead, facial fractures  History of Presenting Illness:    Eddie Fitzgerald is a  33 y.o. male presenting with facial injury.  He had a seizure and had brief loss of consciousness and fell and hit the left side of his face.  He had a little bit of nosebleeding.  He does remember the incident.  He has a history of seizures.  He has a large laceration of the left forehead.  No issues with occlusion as he is edentulous and wears dentures.  No vision changes.  No facial numbness.  No prior nasal injuries or surgeries.  Vision is intact and not blurry.  Past Medical History:  Diagnosis Date  . Seizures (HCC)     History reviewed. No pertinent surgical history.  History reviewed. No pertinent family history.  Social History   Socioeconomic History  . Marital status: Single    Spouse name: Not on file  . Number of children: Not on file  . Years of education: Not on file  . Highest education level: Not on file  Occupational History  . Not on file  Social Needs  . Financial resource strain: Not on file  . Food insecurity    Worry: Not on file    Inability: Not on file  . Transportation needs    Medical: Not on file    Non-medical: Not on file  Tobacco Use  . Smoking status: Current Every Day Smoker  Substance and Sexual Activity  . Alcohol use: Never    Frequency: Never  . Drug use: Never  . Sexual activity: Not on file  Lifestyle  . Physical activity    Days per week: Not on file    Minutes per session: Not on file  . Stress: Not on file  Relationships  . Social Musician on phone: Not on file    Gets together: Not on file    Attends religious service: Not on file    Active member of club or organization: Not on file    Attends meetings of clubs or organizations: Not on  file    Relationship status: Not on file  Other Topics Concern  . Not on file  Social History Narrative  . Not on file    No current facility-administered medications on file prior to encounter.    No current outpatient medications on file prior to encounter.    Not on File   Review of Systems: Complete ROS.  Negative except the above-mentioned OBJECTIVE: Vital Signs: Vitals:   05/01/19 1100 05/01/19 1115  BP: 115/76 122/84  Pulse: 78 86  Resp: 18 16  Temp:    SpO2: 95% 96%    I&O No intake or output data in the 24 hours ending 05/01/19 1156  Physical Exam General: Well developed, well nourished. No acute distress. Voice without dysphonia.  Noncooperative.  Head/Face: Normocephalic, atraumatic.  Large complex laceration over the left brow and extending slightly onto the left upper lid.  This is about 5 cm in length and stellate in nature involving skin as well as muscle and soft tissue  Eyes: Globes well positioned, no proptosis Lids: No periorbital edema/ecchymosis. No lid laceration Conjunctiva: No chemosis, hemorrhage PERRL Extra occular movement: Full ROM bilaterally. No gaze restriction    Ears: No gross deformity. Normal external canal. Tympanic membrane intact bilaterally and  without effusion  Hearing:  Normal speech reception.  Nose:  Mild displacement of the nasal dorsum to the right.  Septum is without hematoma though does feel somewhat mobile  Mouth/Oropharynx: Lips without any lesions.  Edentulous wearing dentures.  The palate is stable.  No mucosal lesions within the oropharynx. No tonsillar enlargement, exudate, or lesions. Pharyngeal walls symmetrical. Uvula midline. Tongue midline without lesions.  Neck: Trachea midline. No masses. No thyromegaly or nodules palpated. No crepitus.  Lymphatic: No lymphadenopathy in the neck.  Respiratory: No stridor or distress.  Cardiovascular: Regular rate and rhythm.  Extremities: No edema or cyanosis. Warm and  well-perfused.  Skin: No scars or lesions on face or neck.  Neurologic: CN II-XII intact. Moving all extremities without gross abnormality.  Other:      Labs: Lab Results  Component Value Date   WBC 10.7 (H) 05/01/2019   HGB 14.4 05/01/2019   HCT 44.3 05/01/2019   PLT 177 05/01/2019   NA 139 05/01/2019   K 4.7 05/01/2019   CL 108 05/01/2019   CREATININE 0.93 05/01/2019   BUN 10 05/01/2019   CO2 19 (L) 05/01/2019     Review of Ancillary Data / Diagnostic Tests: CT head demonstrates nasal bone fracture which is moderately displaced.  Nondisplaced left zygoma.  Anterior wall left maxilla.  Recommend CT maxillofacial.  Procedure: Complex left brow laceration repair Findings: The laceration involves skin, soft tissue and muscle and had a little bit of foreign body in it which was irrigated.  Laceration was about 5 cm in length. Details: Verbal consent was obtained with the patient and his mother at the bedside.  The skin was cleansed with Betadine.  Next, 1% lidocaine with 1: 100,000 epinephrine was injected submucosally.  The wound bed was copiously irrigated with saline.  Next, the skin was then prepped again with Betadine.  The surgical field was prepped and draped in usual sterile fashion.  Soft tissue was reapproximated with buried interrupted 4-0 undyed Vicryl.  The skin was then closed with running locking 5-0 fast-absorbing gut suture.  Skin was then again cleansed with saline.  Bacitracin was applied.  All instrumentation was removed.  The patient tolerated the procedure well and there were no complication.  ASSESSMENT:  33 y.o. male with facial injury including laceration and facial fracture.  RECOMMENDATIONS: Recommend CT maxillofacial No nose blowing for a week Ice packs to face to help with swelling Bacitracin ointment twice daily to the left brow repaired laceration Keflex for 1 week Follow-up with me in 6 days to reevaluate the nasal bone fracture.    Gavin Pound, MD  [facility] [place] Office phone [phone]

## 2019-05-01 NOTE — ED Notes (Signed)
Patient transported to CT 

## 2019-05-01 NOTE — ED Provider Notes (Signed)
[facility] EMERGENCY DEPARTMENT Provider Note   CSN: [id number] Arrival date & time: 05/01/19  4332     History   Chief Complaint Chief Complaint  Patient presents with  . Seizures    HPI Eddie Fitzgerald is a 33 y.o. male.     HPI  33 year old comes in a chief complaint of seizure. Patient has history of seizure and he is on Topamax, eslicarbazepine -he is managed at [facility].  Patient reports that his been taking his medications as prescribed and typically gets a seizure once a month or so.  Today he was at a grocery store when he had an episode of seizure.  He has been taking his medications as prescribed.  He denies any substance abuse, increased stress, recent illnesses.  Patient has a large laceration to his forehead.  He is unsure when his last tetanus shot was.  He is currently complaining of headache.  Past Medical History:  Diagnosis Date  . Seizures (HCC)     There are no active problems to display for this patient.     Home Medications    Prior to Admission medications   Not on File    Family History History reviewed. No pertinent family history.  Social History Social History   Tobacco Use  . Smoking status: Current Every Day Smoker  Substance Use Topics  . Alcohol use: Never    Frequency: Never  . Drug use: Never     Allergies   Patient has no allergy information on record.   Review of Systems Review of Systems  Constitutional: Positive for activity change.  Eyes: Negative for photophobia and visual disturbance.  Gastrointestinal: Negative for nausea and vomiting.  Skin: Positive for wound.  Allergic/Immunologic: Negative for immunocompromised state.  Neurological: Positive for seizures and headaches.  Hematological: Does not bruise/bleed easily.     Physical Exam Updated Vital Signs BP 111/75   Pulse 70   Temp 97.8 F (36.6 C) (Axillary)   Resp 20   SpO2 94%   Physical Exam  Vitals signs and nursing note reviewed.  Constitutional:      Appearance: He is well-developed.     Comments: Somnolent  HENT:     Head: Atraumatic.  Eyes:     Extraocular Movements: Extraocular movements intact.     Pupils: Pupils are equal, round, and reactive to light.     Comments: Bedside visual acuity exam is normal  Neck:     Musculoskeletal: Neck supple.  Cardiovascular:     Rate and Rhythm: Normal rate.  Pulmonary:     Effort: Pulmonary effort is normal.  Musculoskeletal:     Comments: 5 cm laceration to the forehead -left side, covering the eyebrow.  Skin:    General: Skin is warm.  Neurological:     General: No focal deficit present.     Mental Status: He is oriented to person, place, and time.     Cranial Nerves: No cranial nerve deficit.     Sensory: No sensory deficit.               ED Treatments / Results  Labs (all labs ordered are listed, but only abnormal results are displayed) Labs Reviewed  CBC WITH DIFFERENTIAL/PLATELET - Abnormal; Notable for the following components:      Result Value   WBC 10.7 (*)    Neutro Abs 9.3 (*)    All other components within normal limits  BASIC METABOLIC PANEL -  Abnormal; Notable for the following components:   CO2 19 (*)    All other components within normal limits  ETHANOL  CBG MONITORING, ED    EKG EKG Interpretation  Date/Time:  Tuesday May 01 2019 08:38:50 EST Ventricular Rate:  93 PR Interval:    QRS Duration: 96 QT Interval:  348 QTC Calculation: 433 R Axis:   -35 Text Interpretation: Sinus rhythm Left axis deviation RSR' in V1 or V2, probably normal variant No acute changes No old tracing to compare Confirmed by Derwood KaplanNanavati, Doratha Mcswain [phone]([id number]) on 05/01/2019 12:47:41 PM   Radiology Ct Head Wo Contrast  Result Date: 05/01/2019 CLINICAL DATA:  Head trauma EXAM: CT HEAD WITHOUT CONTRAST TECHNIQUE: Contiguous axial images were obtained from the base of the skull through the vertex without  intravenous contrast. COMPARISON:  February 19, 2019 FINDINGS: Brain: There is no acute intracranial hemorrhage, mass effect, or edema. Gray-white differentiation remains preserved. There is no extra-axial fluid collection. Ventricles and sulci are normal in size and configuration. Vascular: Unremarkable. Skull: Calvarium is intact. Sinuses/Orbits: Patchy paranasal sinus opacification. No intraorbital hematoma identified. Other: Multiple acute facial fractures are present including involvement of the nasal bones, left zygomatic arch, left pterygoid plate, and likely the left maxillary sinus. Dedicated maxillofacial imaging is recommended. Left anterior frontal scalp laceration is present. IMPRESSION: No evidence of acute intracranial injury. Multiple acute facial fractures. Dedicated maxillofacial imaging is needed for better evaluation. Electronically Signed   By: Guadlupe SpanishPraneil  Patel M.D.   On: 05/01/2019 09:47    Procedures Procedures (including critical care time)  Medications Ordered in ED Medications  0.9 %  sodium chloride infusion ( Intravenous New Bag/Given 05/01/19 0951)  acetaminophen (TYLENOL) tablet 650 mg (has no administration in time range)  naproxen (NAPROSYN) tablet 500 mg (has no administration in time range)  lidocaine-EPINEPHrine (XYLOCAINE W/EPI) 2 %-1:200000 (PF) injection 20 mL (20 mLs Infiltration Given by Other 05/01/19 0951)  Tdap (BOOSTRIX) injection 0.5 mL (0.5 mLs Intramuscular Given 05/01/19 0951)  lidocaine-EPINEPHrine (XYLOCAINE W/EPI) 1 %-1:100000 (with pres) injection 20 mL (20 mLs Infiltration Given by Other 05/01/19 1033)  fentaNYL (SUBLIMAZE) injection 50 mcg (50 mcg Intravenous Given 05/01/19 1033)     Initial Impression / Assessment and Plan / ED Course  I have reviewed the triage vital signs and the nursing notes.  Pertinent labs & imaging results that were available during my care of the patient were reviewed by me and considered in my medical decision making  (see chart for details).        Patient comes in a chief complaint of seizure.  He is noted to have a laceration to his forehead.  He is somnolent but oriented x3 and informs me that he has been taking his medication as prescribed.  He gets seizure once every month or so.  History is not suggestive of any specific cause for him to have seizures.  Given that he is already on couple of agents and despite that is having seizure, [facility].  I spoke with Dr. Tera HelperBoggs, and she will ensure that patient is seen by epilepsy team soon.  She does not want any medication adjustment to be done at this time unless patient has repeat episode whilst in the ED.  Patient's laceration is complex.  Dr. Doran HeaterMarcellino, ENT has come by and repaired the laceration.  She recommends patient be discharged with Keflex, bacitracin and ENT follow-up.  CT max face has been added at the request of ENT  given concerns for likely zygomatic arch fracture.  Visual exam is normal and EOMI.  The patient appears reasonably screened and/or stabilized for discharge and I doubt any other medical condition or other [facility] requiring further screening, evaluation, or treatment in the ED at this time prior to discharge.   Results from the ER workup discussed with the patient face to face and all questions answered to the best of my ability. The patient is safe for discharge with strict return precautions.   Final Clinical Impressions(s) / ED Diagnoses   Final diagnoses:  Closed fracture of left zygomatic arch, initial encounter [facility])  Seizure disorder (HCC)  Face lacerations, initial encounter    ED Discharge Orders    None       Varney Biles, MD 05/01/19 1305

## 2019-05-01 NOTE — ED Triage Notes (Signed)
Pt here from a parking lot where he was witnessed to have a seizure and fell to the ground hitting his head , pt has a lac above the left eye, pt received 5 of versed due to being combative with ems , pt arrives awake but confused

## 2019-09-26 ENCOUNTER — Encounter (HOSPITAL_COMMUNITY): Payer: Self-pay | Admitting: Emergency Medicine

## 2019-09-26 ENCOUNTER — Emergency Department (HOSPITAL_COMMUNITY)
Admission: EM | Admit: 2019-09-26 | Discharge: 2019-09-26 | Disposition: A | Discharge status: Home / Self Care | Payer: Medicare HMO | Attending: Emergency Medicine | Admitting: Emergency Medicine

## 2019-09-26 ENCOUNTER — Other Ambulatory Visit: Payer: Self-pay

## 2019-09-26 ENCOUNTER — Emergency Department (HOSPITAL_COMMUNITY): Payer: Medicare HMO

## 2019-09-26 DIAGNOSIS — Z20822 Contact with and (suspected) exposure to covid-19: Secondary | ICD-10-CM | POA: Insufficient documentation

## 2019-09-26 DIAGNOSIS — F17210 Nicotine dependence, cigarettes, uncomplicated: Secondary | ICD-10-CM | POA: Insufficient documentation

## 2019-09-26 DIAGNOSIS — S8781XA Crushing injury of right lower leg, initial encounter: Secondary | ICD-10-CM | POA: Insufficient documentation

## 2019-09-26 DIAGNOSIS — Y929 Unspecified place or not applicable: Secondary | ICD-10-CM | POA: Insufficient documentation

## 2019-09-26 DIAGNOSIS — T148XXA Other injury of unspecified body region, initial encounter: Secondary | ICD-10-CM

## 2019-09-26 DIAGNOSIS — Y999 Unspecified external cause status: Secondary | ICD-10-CM | POA: Diagnosis not present

## 2019-09-26 DIAGNOSIS — Y939 Activity, unspecified: Secondary | ICD-10-CM | POA: Diagnosis not present

## 2019-09-26 DIAGNOSIS — Z79899 Other long term (current) drug therapy: Secondary | ICD-10-CM | POA: Diagnosis not present

## 2019-09-26 DIAGNOSIS — W208XXA Other cause of strike by thrown, projected or falling object, initial encounter: Secondary | ICD-10-CM | POA: Insufficient documentation

## 2019-09-26 LAB — RESPIRATORY PANEL BY RT PCR (FLU A&B, COVID)
Influenza A by PCR: NEGATIVE
Influenza B by PCR: NEGATIVE
SARS Coronavirus 2 by RT PCR: NEGATIVE

## 2019-09-26 LAB — COMPREHENSIVE METABOLIC PANEL
ALT: 16 U/L (ref 0–44)
AST: 25 U/L (ref 15–41)
Albumin: 3.9 g/dL (ref 3.5–5.0)
Alkaline Phosphatase: 81 U/L (ref 38–126)
Anion gap: 9 (ref 5–15)
BUN: 12 mg/dL (ref 6–20)
CO2: 22 mmol/L (ref 22–32)
Calcium: 9 mg/dL (ref 8.9–10.3)
Chloride: 107 mmol/L (ref 98–111)
Creatinine, Ser: 1.05 mg/dL (ref 0.61–1.24)
GFR calc Af Amer: >60 mL/min (ref >60)
GFR calc non Af Amer: >60 mL/min (ref >60)
Glucose, Bld: 111 mg/dL — ABNORMAL HIGH (ref 70–99)
Potassium: 3.4 mmol/L — ABNORMAL LOW (ref 3.5–5.1)
Sodium: 138 mmol/L (ref 135–145)
Total Bilirubin: 0.4 mg/dL (ref 0.3–1.2)
Total Protein: 6.6 g/dL (ref 6.5–8.1)

## 2019-09-26 LAB — CBC
HCT: 40.5 % (ref 39.0–52.0)
Hemoglobin: 13.7 g/dL (ref 13.0–17.0)
MCH: 31.5 pg (ref 26.0–34.0)
MCHC: 33.8 g/dL (ref 30.0–36.0)
MCV: 93.1 fL (ref 80.0–100.0)
Platelets: 234 10*3/uL (ref 150–400)
RBC: 4.35 MIL/uL (ref 4.22–5.81)
RDW: 13 % (ref 11.5–15.5)
WBC: 9 10*3/uL (ref 4.0–10.5)
nRBC: 0 % (ref 0.0–0.2)

## 2019-09-26 LAB — LACTIC ACID, PLASMA: Lactic Acid, Venous: 1.2 mmol/L (ref 0.5–1.9)

## 2019-09-26 LAB — PROTIME-INR
INR: 1 (ref 0.8–1.2)
Prothrombin Time: 13.2 seconds (ref 11.4–15.2)

## 2019-09-26 LAB — SAMPLE TO BLOOD BANK

## 2019-09-26 LAB — CK TOTAL AND CKMB (NOT AT ARMC)
CK, MB: 3.8 ng/mL (ref 0.5–5.0)
Relative Index: 1.6 (ref 0.0–2.5)
Total CK: 232 U/L (ref 49–397)

## 2019-09-26 LAB — ETHANOL: Alcohol, Ethyl (B): <10 mg/dL (ref <10)

## 2019-09-26 MED ORDER — CEFAZOLIN SODIUM-DEXTROSE 2-4 GM/100ML-% IV SOLN
2.0000 g | Freq: Once | INTRAVENOUS | Status: AC
  Administered 2019-09-26: 2 g via INTRAVENOUS
  Filled 2019-09-26: qty 100

## 2019-09-26 MED ORDER — LACTATED RINGERS IV BOLUS
1000.0000 mL | Freq: Once | INTRAVENOUS | Status: AC
  Administered 2019-09-26: 1000 mL via INTRAVENOUS

## 2019-09-26 MED ORDER — FENTANYL CITRATE (PF) 100 MCG/2ML IJ SOLN
50.0000 ug | Freq: Once | INTRAMUSCULAR | Status: AC
  Administered 2019-09-26: 50 ug via INTRAVENOUS
  Filled 2019-09-26: qty 2

## 2019-09-26 MED ORDER — HYDROCODONE-ACETAMINOPHEN 5-325 MG PO TABS: 1.0000 | ORAL_TABLET | Freq: Four times a day (QID) | ORAL | 0 refills | Status: AC | PRN

## 2019-09-26 NOTE — Progress Notes (Signed)
Orthopedic Tech Progress Note Patient Details:  Eddie Fitzgerald [birth year] [id number]  Patient ID: [name], male   DOB: 01/19/1986, 34 y.o.   MRN: [id number]   Saul Fordyce 09/26/2019, 6:05 PMLevel 2 Trauma

## 2019-09-26 NOTE — ED Triage Notes (Addendum)
Pt Coming by EMS after being at work and having a car fall off the jack and onto his right ankle for approx 17 minutes. Initial BP 100/50. Pt having pain, pallor, and paraesthesia. Alert and oriented. No LOC. Hx of seizures

## 2019-09-26 NOTE — ED Notes (Signed)
Wound was irrigated and dressed with a non-adhesive dressing.

## 2019-09-26 NOTE — ED Notes (Signed)
Called xray to see where pt was in que to be scanned, stated they were on the way

## 2019-09-26 NOTE — ED Notes (Signed)
Pt discharged and able to ambulate into wheelchair. Pt stated family was coming to pick him up

## 2019-09-26 NOTE — Discharge Instructions (Signed)
As discussed, you have been diagnosed with a crush injury.  It is important you monitor your condition carefully and if you develop new, or concerning changes, such as loss of sensation in the foot, inability to move the ankle, or swelling with distention in the calf, it is important that you return here for further evaluation.  Otherwise, please follow-up with your primary care physician.

## 2019-09-26 NOTE — ED Provider Notes (Signed)
[facility] EMERGENCY DEPARTMENT Provider Note   CSN: [id number] Arrival date & time: 09/26/19  1747     History No chief complaint on file.   Eddie Fitzgerald is a 34 y.o. male.  HPI    Patient presents after sustaining a crush injury.  The patient arrives via EMS and history is obtained by those individuals as well as the patient himself. Patient was working on a car, when it slipped off of it elevated stand, and the break assembly landed on the patient's right leg, trapping him.  Entrapment was about 17 minutes, until EMS was able to remove the vehicle. Patient describes severe pain in the right calf, medially.  No medication given for relief, pain is worse with palpation or motion of the distal leg.  EMS providers note that initially the patient had pallor distally, pain, but was hemodynamically unremarkable. Patient still states that he is generally well though acknowledges a history of seizure disorder. Last tetanus was within the past year. Past Medical History:  Diagnosis Date  . Seizures ([place])     There are no problems to display for this patient.   No past surgical history on file.     No family history on file.  Social History   Tobacco Use  . Smoking status: Current Every Day Smoker  Substance Use Topics  . Alcohol use: Never  . Drug use: Never    Home Medications Prior to Admission medications   Medication Sig Start Date End Date Taking? Authorizing Provider  bacitracin ointment Apply 1 application topically 2 (two) times daily. 05/01/19   Varney Biles, MD  BRIVIACT 50 MG TABS Take 1 tablet by mouth 2 (two) times daily. 07/26/19   [provider]  cephALEXin (KEFLEX) 500 MG capsule Take 1 capsule (500 mg total) by mouth 4 (four) times daily. 05/01/19   Varney Biles, MD    Allergies    Advil [ibuprofen] and Tylenol [acetaminophen]  Review of Systems   Review of Systems  Constitutional:       Per HPI, otherwise  negative  HENT:       Per HPI, otherwise negative  Respiratory:       Per HPI, otherwise negative  Cardiovascular:       Per HPI, otherwise negative  Gastrointestinal: Negative for vomiting.  Endocrine:       Negative aside from HPI  Genitourinary:       Neg aside from HPI   Musculoskeletal:       Per HPI, otherwise negative  Skin: Positive for wound.  Neurological: Positive for seizures and numbness. Negative for syncope.    Physical Exam Updated Vital Signs BP 112/77   Pulse 65   Resp 17   Ht 5\' 6"  (1.676 m)   Wt 66.2 kg   SpO2 100%   BMI 23.57 kg/m   Physical Exam Vitals and nursing note reviewed.  Constitutional:      General: He is not in acute distress.    Appearance: He is well-developed.  HENT:     Head: Normocephalic and atraumatic.  Eyes:     Conjunctiva/sclera: Conjunctivae normal.  Cardiovascular:     Rate and Rhythm: Normal rate and regular rhythm.  Pulmonary:     Effort: Pulmonary effort is normal. No respiratory distress.     Breath sounds: No stridor.  Abdominal:     General: There is no distension.  Musculoskeletal:     Comments: Left lower extremity unremarkable, right knee unremarkable,  right ankle unremarkable, the patient flexes and extends this against resistance appropriately. Right mid calf medially there is a 3 cm x 1 cm abrasion with surrounding smaller abrasions as well as erythema. DP, PT pulses both appreciable.  Skin:    General: Skin is warm and dry.  Neurological:     Mental Status: He is alert and oriented to person, place, and time.      ED Results / Procedures / Treatments   Labs (all labs ordered are listed, but only abnormal results are displayed) Labs Reviewed  COMPREHENSIVE METABOLIC PANEL - Abnormal; Notable for the following components:      Result Value   Potassium 3.4 (*)    Glucose, Bld 111 (*)    All other components within normal limits  RESPIRATORY PANEL BY RT PCR (FLU A&B, COVID)  CBC  ETHANOL  LACTIC  ACID, PLASMA  PROTIME-INR  CK TOTAL AND CKMB (NOT AT [facility])  URINALYSIS, ROUTINE W REFLEX MICROSCOPIC  SAMPLE TO BLOOD BANK    EKG None  Radiology DG Tibia/Fibula Right Port  Result Date: 09/26/2019 CLINICAL DATA:  Heavy object fell on leg EXAM: PORTABLE RIGHT TIBIA AND FIBULA - 2 VIEW COMPARISON:  None. FINDINGS: Small amount of soft tissue gas noted adjacent to the tibia. No acute bony abnormality. Specifically, no fracture, subluxation, or dislocation. IMPRESSION: No acute bony abnormality. Electronically Signed   By: Charlett Nose M.D.   On: 09/26/2019 18:50    Procedures Procedures (including critical care time)  Medications Ordered in ED Medications  fentaNYL (SUBLIMAZE) injection 50 mcg (has no administration in time range)  ceFAZolin (ANCEF) IVPB 2g/100 mL premix (0 g Intravenous Stopped 09/26/19 1838)  fentaNYL (SUBLIMAZE) injection 50 mcg (50 mcg Intravenous Given 09/26/19 1806)  lactated ringers bolus 1,000 mL (1,000 mLs Intravenous New Bag/Given 09/26/19 1802)    ED Course  I have reviewed the triage vital signs and the nursing notes.  Pertinent labs & imaging results that were available during my care of the patient were reviewed by me and considered in my medical decision making (see chart for details).   Generally well young male, though with seizure disorder presents after crush injury.  Patient is awake and alert, hemodynamically unremarkable, but presenting with signs and symptoms concerning for crush.  Patient is initially distally neurovascularly intact, but given the mechanism of injury, there is suspicion for early rhabdo versus fracture versus contusion. Patient received empiric antibiotics, tetanus status was ensured, x-rays ordered, pain medicine ordered, the patient will require labs, monitoring.   8:53 PM Patient in similar condition, no substantial new swelling about the area he continues to move his ankle freely. I reviewed the x-rays, discussed with him  reviewed his labs which are generally reassuring. Now after several hours of monitoring, even with no appreciable change in the size of his leg, nor any distention, low suspicion for compartment syndrome. I discussed his case with the orthopedic surgeon on-call, we discussed appropriate therapy, return precautions, follow-up instructions, and is designated as appropriate for discharge.   Final Clinical Impression(s) / ED Diagnoses Final diagnoses:  Crush injury  Crush injury    Rx / DC Orders ED Discharge Orders         Ordered    HYDROcodone-acetaminophen (NORCO/VICODIN) 5-325 MG tablet  Every 6 hours PRN     09/26/19 2109           Gerhard Munch, MD 09/26/19 2109

## 2021-04-14 IMAGING — CT CT HEAD W/O CM
4 series · 16 of 47 positions shown, 18 images · non-contrast
Comparison: February 19, 2019

CLINICAL DATA: Head trauma

EXAM:
CT HEAD WITHOUT CONTRAST
TECHNIQUE: Contiguous axial images were obtained from the base of the skull
through the vertex without intravenous contrast.

[Series 3: head without · axial · non-contrast · 0.47mm/px · z∈[-146,-36]mm · 6 of 32 slices shown, 8 images]
[im 5/32  brain]
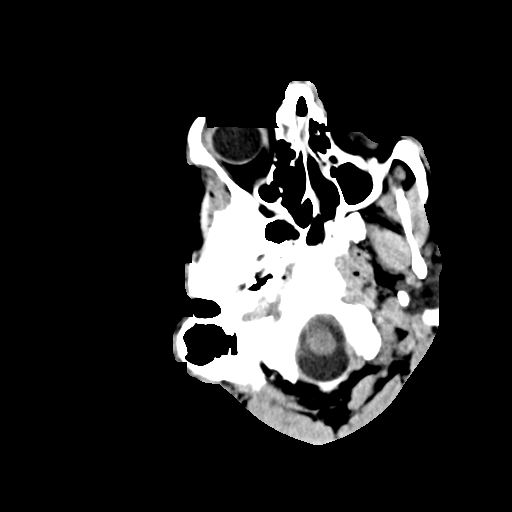
[im 5/32  bone]
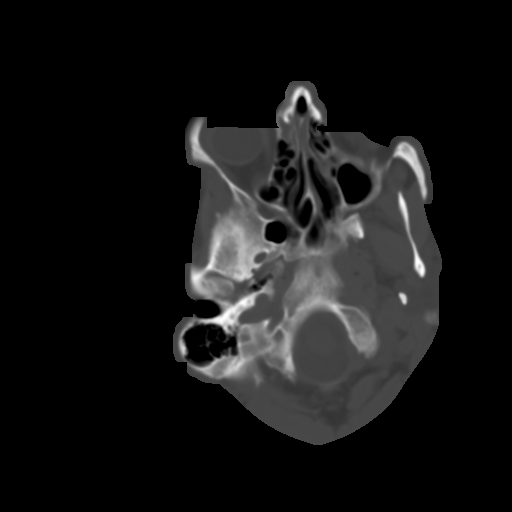
[im 9/32  brain]
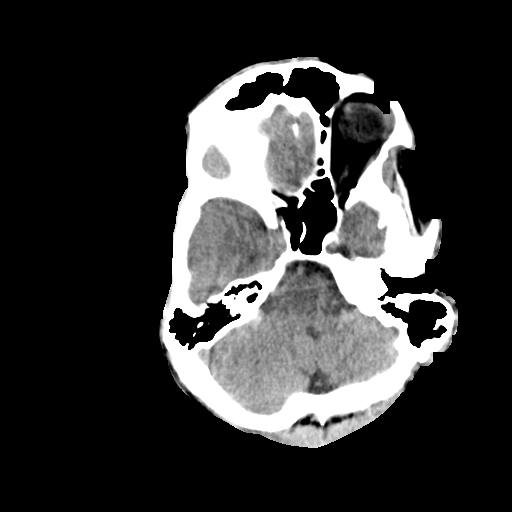
[im 14/32  brain]
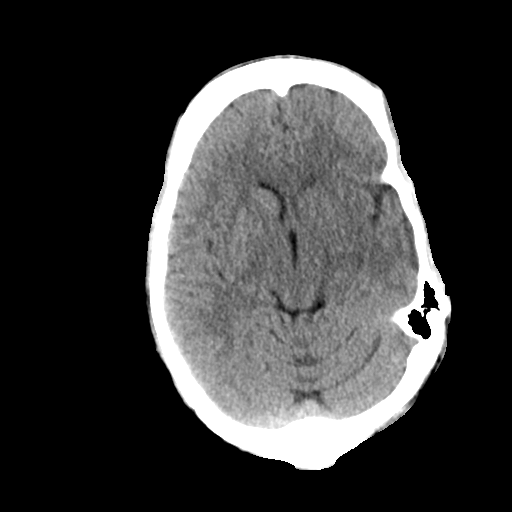
[im 18/32  brain]
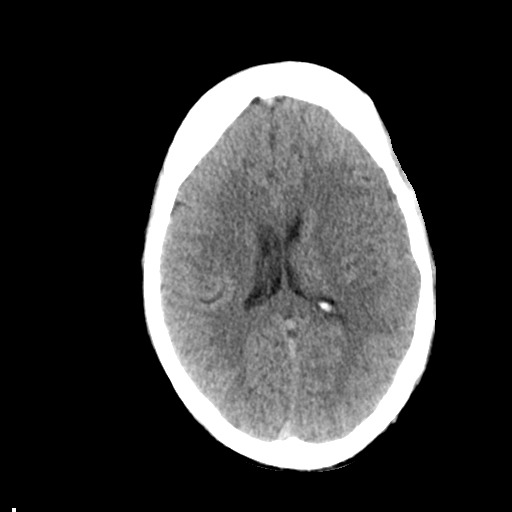
[im 23/32  brain]
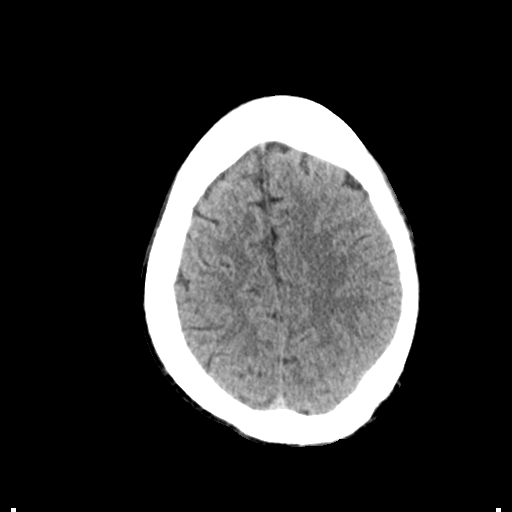
[im 23/32  bone]
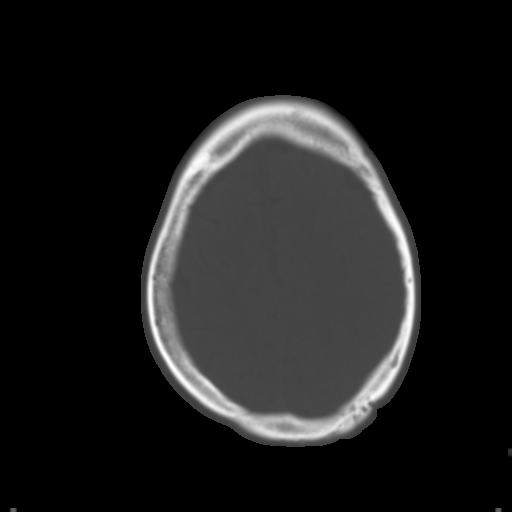
[im 27/32  brain]
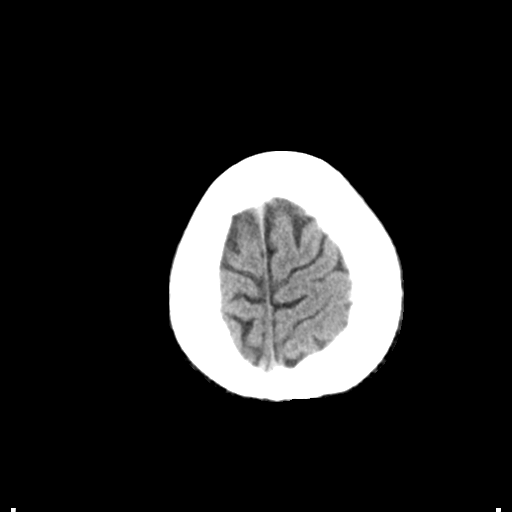

[Series 4: head bone · axial · 0.47mm/px · z∈[-152,-98]mm · 4 of 82 slices shown]
[im 8/82  bone]
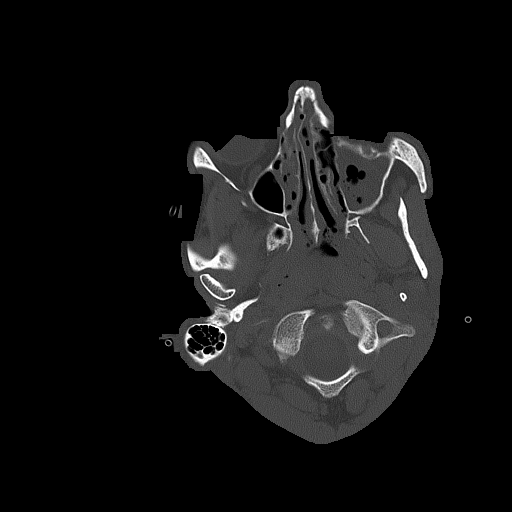
[im 16/82  bone]
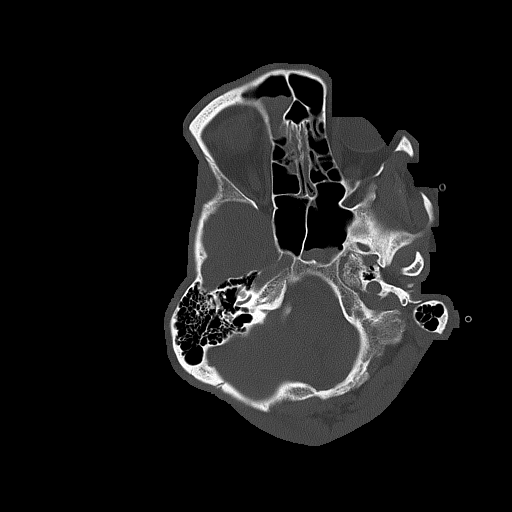
[im 28/82  bone]
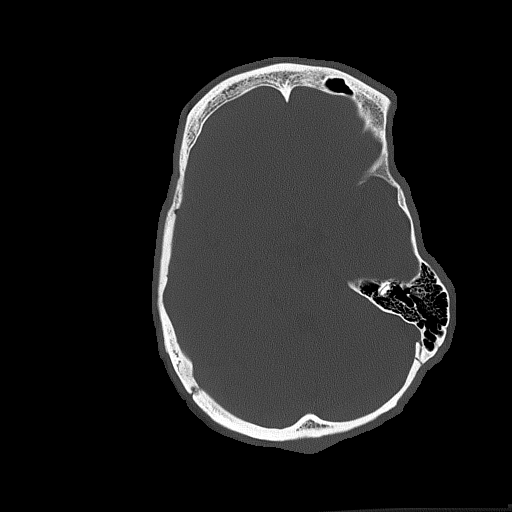
[im 35/82  bone]
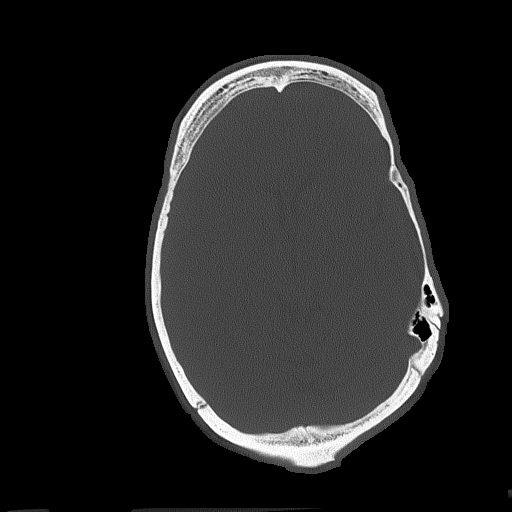

[Series 5: head without cor · coronal · non-contrast · 0.32mm/px · 3 of 68 slices shown]
[im 23/68  brain]
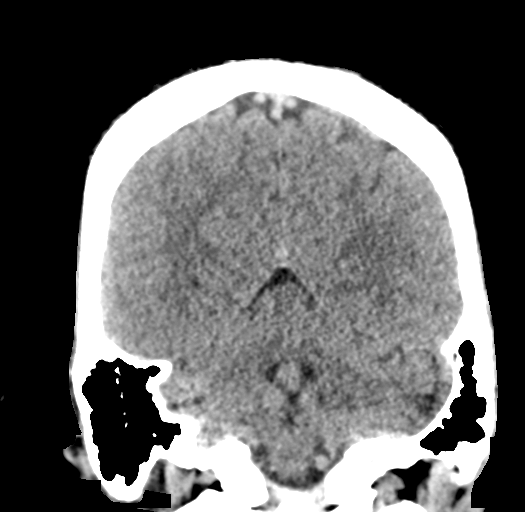
[im 30/68  brain]
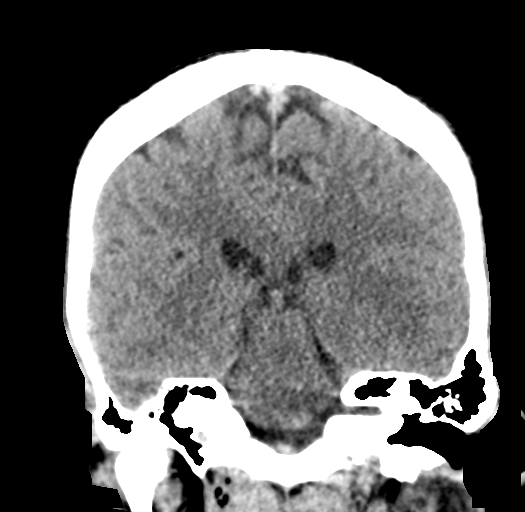
[im 38/68  brain]
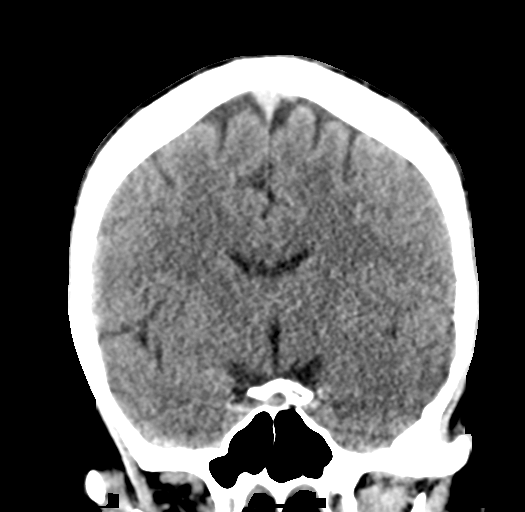

[Series 6: head without sag · sagittal · non-contrast · 0.32mm/px · 3 of 49 slices shown]
[im 17/49  brain]
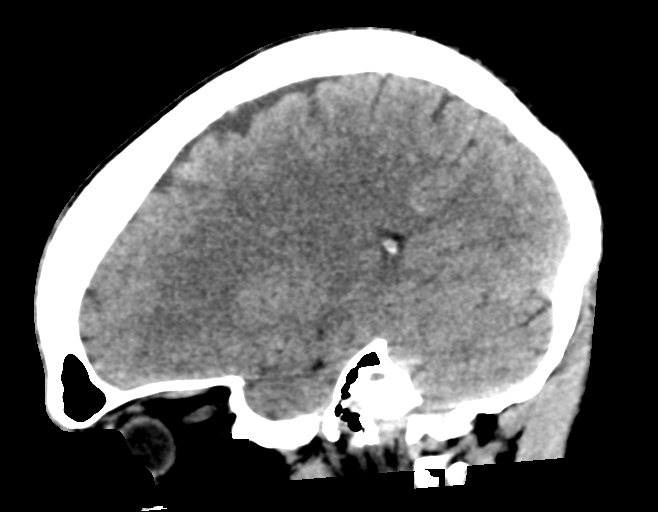
[im 25/49  brain]
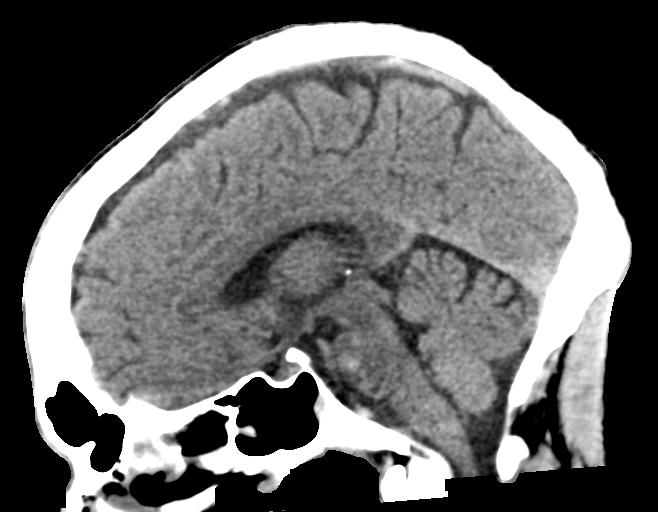
[im 32/49  brain]
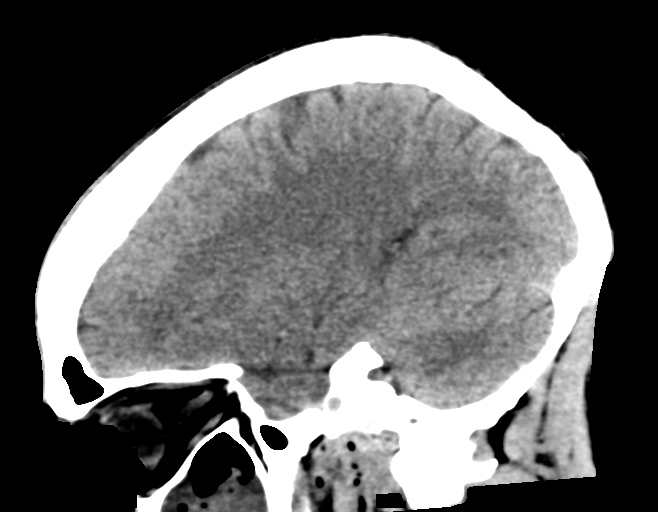

[16 of 47 positions shown; findings below may reference images not displayed]

FINDINGS: Brain: There is no acute intracranial hemorrhage, mass effect, or
edema. Gray-white differentiation remains preserved. There is no
extra-axial fluid collection. Ventricles and sulci are normal in
size and configuration.

Vascular: Unremarkable.

Skull: Calvarium is intact.

Sinuses/Orbits: Patchy paranasal sinus opacification. No
intraorbital hematoma identified.

Other: Multiple acute facial fractures are present including
involvement of the nasal bones, left zygomatic arch, left pterygoid
plate, and likely the left maxillary sinus. Dedicated maxillofacial
imaging is recommended. Left anterior frontal scalp laceration is
present.
IMPRESSION: No evidence of acute intracranial injury.

Multiple acute facial fractures. Dedicated maxillofacial imaging is
needed for better evaluation.

## 2021-09-09 IMAGING — DX DG TIBIA/FIBULA PORT 2V*R*
2 series · 2 of 2 positions shown · non-contrast
Comparison: None.

CLINICAL DATA: Heavy object fell on leg

EXAM:
PORTABLE RIGHT TIBIA AND FIBULA - 2 VIEW

[tibia ap]
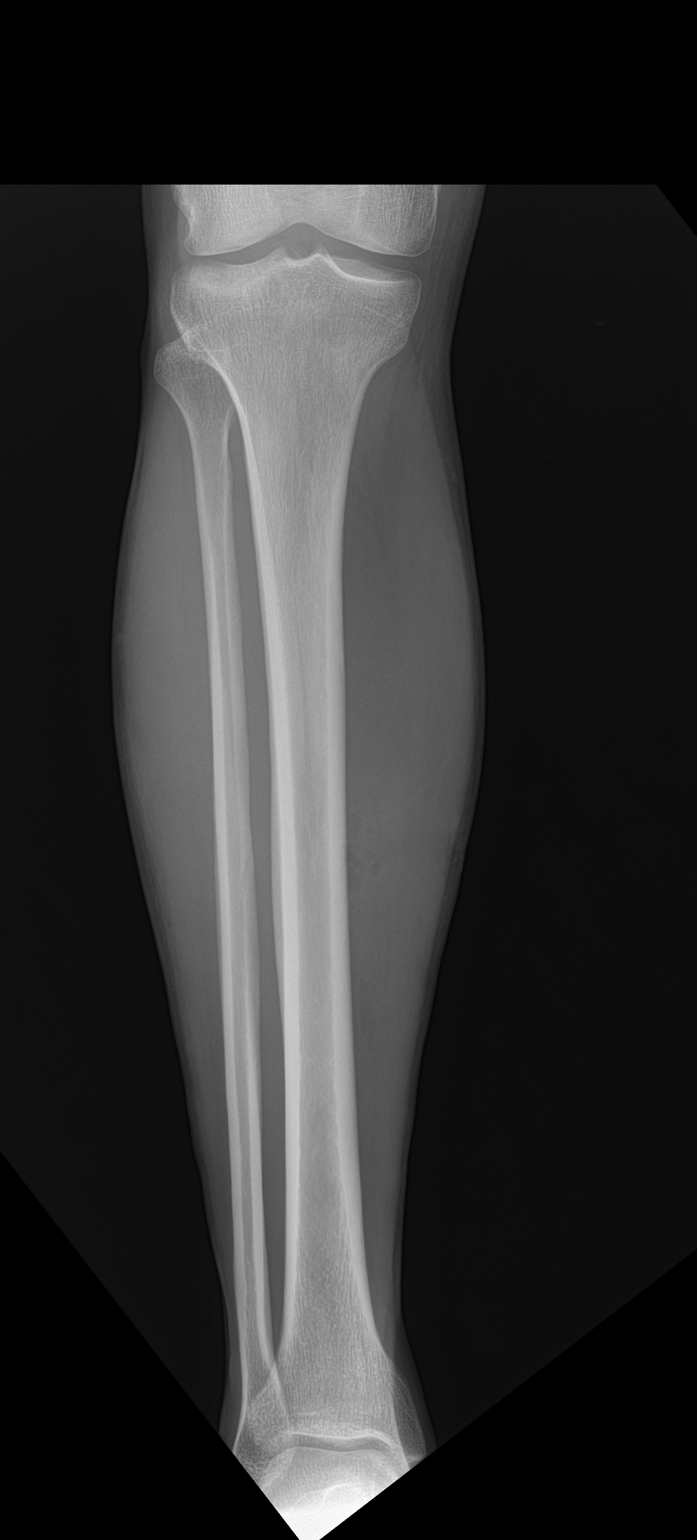

[tibia lat]
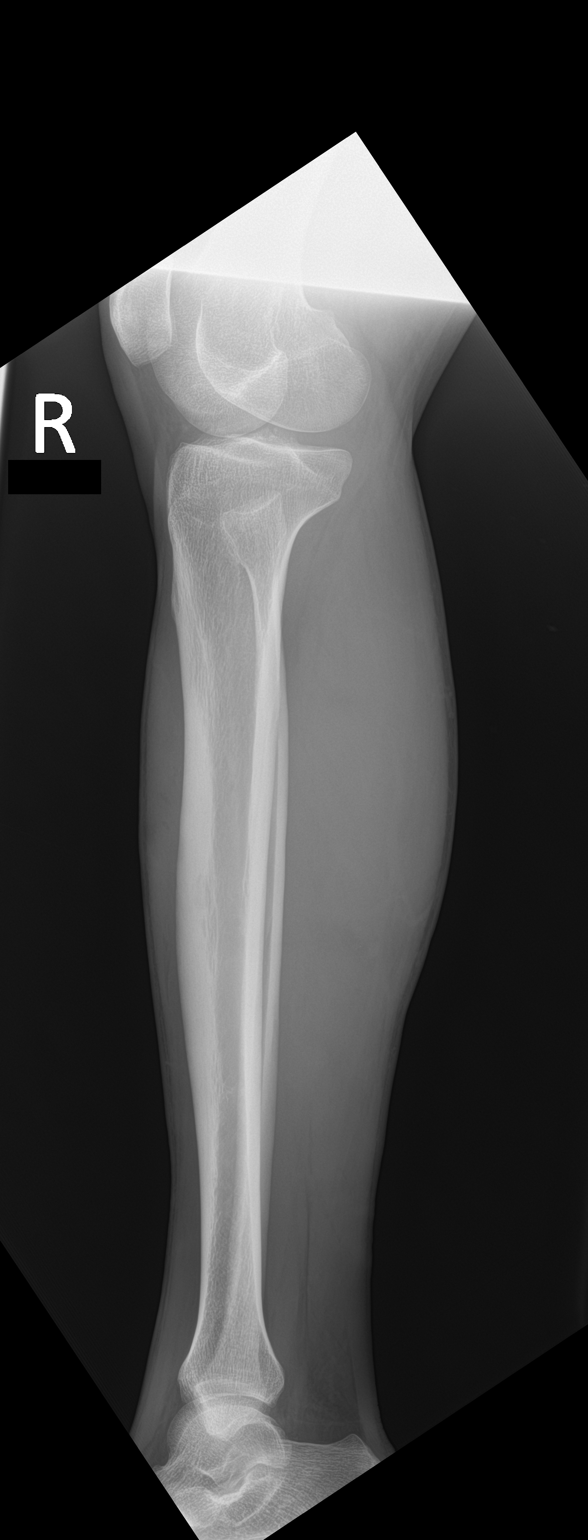

[2 of 2 positions shown; findings below may reference images not displayed]

FINDINGS: Small amount of soft tissue gas noted adjacent to the tibia. No
acute bony abnormality. Specifically, no fracture, subluxation, or
dislocation.
IMPRESSION: No acute bony abnormality.
